# Patient Record
Sex: Female | Born: 1940
Health system: Southern US, Community
[De-identification: ages and names within clinical notes are randomized; demographics above are authoritative.]

## PROBLEM LIST (undated history)

## (undated) DIAGNOSIS — N189 Chronic kidney disease, unspecified: Secondary | ICD-10-CM

## (undated) DIAGNOSIS — E039 Hypothyroidism, unspecified: Secondary | ICD-10-CM

## (undated) DIAGNOSIS — C801 Malignant (primary) neoplasm, unspecified: Secondary | ICD-10-CM

## (undated) DIAGNOSIS — M1990 Unspecified osteoarthritis, unspecified site: Secondary | ICD-10-CM

## (undated) DIAGNOSIS — C4490 Unspecified malignant neoplasm of skin, unspecified: Secondary | ICD-10-CM

## (undated) DIAGNOSIS — I10 Essential (primary) hypertension: Secondary | ICD-10-CM

## (undated) DIAGNOSIS — M810 Age-related osteoporosis without current pathological fracture: Secondary | ICD-10-CM

## (undated) DIAGNOSIS — E785 Hyperlipidemia, unspecified: Secondary | ICD-10-CM

## (undated) HISTORY — PX: ABDOMINAL HYSTERECTOMY: SHX81

## (undated) HISTORY — PX: COLONOSCOPY: SHX174

## (undated) HISTORY — PX: CHOLECYSTECTOMY: SHX55

---

## 1978-07-23 DIAGNOSIS — C801 Malignant (primary) neoplasm, unspecified: Secondary | ICD-10-CM

## 1978-07-23 HISTORY — DX: Malignant (primary) neoplasm, unspecified: C80.1

## 2005-11-21 ENCOUNTER — Ambulatory Visit: Payer: Self-pay

## 2006-01-01 ENCOUNTER — Ambulatory Visit: Payer: Self-pay | Admitting: Nurse Practitioner

## 2007-02-12 ENCOUNTER — Ambulatory Visit: Payer: Self-pay | Admitting: Gastroenterology

## 2007-04-16 ENCOUNTER — Ambulatory Visit: Payer: Self-pay | Admitting: Family Medicine

## 2008-06-03 ENCOUNTER — Ambulatory Visit: Payer: Self-pay | Admitting: Family Medicine

## 2009-06-08 ENCOUNTER — Ambulatory Visit: Payer: Self-pay | Admitting: Family Medicine

## 2009-09-12 ENCOUNTER — Ambulatory Visit: Payer: Self-pay | Admitting: Gastroenterology

## 2010-06-19 ENCOUNTER — Ambulatory Visit: Payer: Self-pay | Admitting: Family Medicine

## 2011-07-26 ENCOUNTER — Ambulatory Visit: Payer: Self-pay | Admitting: Family Medicine

## 2012-07-31 ENCOUNTER — Ambulatory Visit: Payer: Self-pay | Admitting: Family Medicine

## 2013-08-03 ENCOUNTER — Ambulatory Visit: Payer: Self-pay | Admitting: Family Medicine

## 2014-07-26 DIAGNOSIS — E039 Hypothyroidism, unspecified: Secondary | ICD-10-CM | POA: Diagnosis not present

## 2014-07-26 DIAGNOSIS — M810 Age-related osteoporosis without current pathological fracture: Secondary | ICD-10-CM | POA: Diagnosis not present

## 2014-07-26 DIAGNOSIS — I10 Essential (primary) hypertension: Secondary | ICD-10-CM | POA: Diagnosis not present

## 2014-07-26 DIAGNOSIS — Z0000 Encounter for general adult medical examination without abnormal findings: Secondary | ICD-10-CM | POA: Diagnosis not present

## 2014-07-26 DIAGNOSIS — R300 Dysuria: Secondary | ICD-10-CM | POA: Diagnosis not present

## 2014-07-26 DIAGNOSIS — K219 Gastro-esophageal reflux disease without esophagitis: Secondary | ICD-10-CM | POA: Diagnosis not present

## 2014-08-05 ENCOUNTER — Ambulatory Visit: Payer: Self-pay | Admitting: Family Medicine

## 2014-08-05 DIAGNOSIS — Z1231 Encounter for screening mammogram for malignant neoplasm of breast: Secondary | ICD-10-CM | POA: Diagnosis not present

## 2014-09-08 DIAGNOSIS — I10 Essential (primary) hypertension: Secondary | ICD-10-CM | POA: Diagnosis not present

## 2014-09-08 DIAGNOSIS — E039 Hypothyroidism, unspecified: Secondary | ICD-10-CM | POA: Diagnosis not present

## 2014-09-20 ENCOUNTER — Ambulatory Visit: Payer: Self-pay | Admitting: Gastroenterology

## 2014-09-20 DIAGNOSIS — I10 Essential (primary) hypertension: Secondary | ICD-10-CM | POA: Diagnosis not present

## 2014-09-20 DIAGNOSIS — D122 Benign neoplasm of ascending colon: Secondary | ICD-10-CM | POA: Diagnosis not present

## 2014-09-20 DIAGNOSIS — Z7982 Long term (current) use of aspirin: Secondary | ICD-10-CM | POA: Diagnosis not present

## 2014-09-20 DIAGNOSIS — E079 Disorder of thyroid, unspecified: Secondary | ICD-10-CM | POA: Diagnosis not present

## 2014-09-20 DIAGNOSIS — K219 Gastro-esophageal reflux disease without esophagitis: Secondary | ICD-10-CM | POA: Diagnosis not present

## 2014-09-20 DIAGNOSIS — K5730 Diverticulosis of large intestine without perforation or abscess without bleeding: Secondary | ICD-10-CM | POA: Diagnosis not present

## 2014-09-20 DIAGNOSIS — Z1211 Encounter for screening for malignant neoplasm of colon: Secondary | ICD-10-CM | POA: Diagnosis not present

## 2014-09-20 DIAGNOSIS — Z86010 Personal history of colonic polyps: Secondary | ICD-10-CM | POA: Diagnosis not present

## 2014-09-20 DIAGNOSIS — D120 Benign neoplasm of cecum: Secondary | ICD-10-CM | POA: Diagnosis not present

## 2014-09-27 DIAGNOSIS — R509 Fever, unspecified: Secondary | ICD-10-CM | POA: Diagnosis not present

## 2014-09-27 DIAGNOSIS — J019 Acute sinusitis, unspecified: Secondary | ICD-10-CM | POA: Diagnosis not present

## 2014-09-27 DIAGNOSIS — B9689 Other specified bacterial agents as the cause of diseases classified elsewhere: Secondary | ICD-10-CM | POA: Diagnosis not present

## 2014-11-15 LAB — SURGICAL PATHOLOGY

## 2015-01-26 DIAGNOSIS — K219 Gastro-esophageal reflux disease without esophagitis: Secondary | ICD-10-CM | POA: Diagnosis not present

## 2015-01-26 DIAGNOSIS — E039 Hypothyroidism, unspecified: Secondary | ICD-10-CM | POA: Diagnosis not present

## 2015-01-26 DIAGNOSIS — I10 Essential (primary) hypertension: Secondary | ICD-10-CM | POA: Diagnosis not present

## 2015-03-31 DIAGNOSIS — R946 Abnormal results of thyroid function studies: Secondary | ICD-10-CM | POA: Diagnosis not present

## 2015-07-28 DIAGNOSIS — E785 Hyperlipidemia, unspecified: Secondary | ICD-10-CM | POA: Diagnosis not present

## 2015-07-28 DIAGNOSIS — I10 Essential (primary) hypertension: Secondary | ICD-10-CM | POA: Diagnosis not present

## 2015-07-28 DIAGNOSIS — M810 Age-related osteoporosis without current pathological fracture: Secondary | ICD-10-CM | POA: Diagnosis not present

## 2015-07-28 DIAGNOSIS — Z1239 Encounter for other screening for malignant neoplasm of breast: Secondary | ICD-10-CM | POA: Diagnosis not present

## 2015-07-28 DIAGNOSIS — E039 Hypothyroidism, unspecified: Secondary | ICD-10-CM | POA: Diagnosis not present

## 2015-07-28 DIAGNOSIS — Z0000 Encounter for general adult medical examination without abnormal findings: Secondary | ICD-10-CM | POA: Diagnosis not present

## 2015-07-28 DIAGNOSIS — K219 Gastro-esophageal reflux disease without esophagitis: Secondary | ICD-10-CM | POA: Diagnosis not present

## 2015-08-05 ENCOUNTER — Other Ambulatory Visit: Payer: Self-pay | Admitting: Family Medicine

## 2015-08-05 DIAGNOSIS — Z1231 Encounter for screening mammogram for malignant neoplasm of breast: Secondary | ICD-10-CM

## 2015-08-08 DIAGNOSIS — M810 Age-related osteoporosis without current pathological fracture: Secondary | ICD-10-CM | POA: Diagnosis not present

## 2015-08-09 DIAGNOSIS — E785 Hyperlipidemia, unspecified: Secondary | ICD-10-CM | POA: Diagnosis not present

## 2015-08-09 DIAGNOSIS — E039 Hypothyroidism, unspecified: Secondary | ICD-10-CM | POA: Diagnosis not present

## 2015-08-09 DIAGNOSIS — I10 Essential (primary) hypertension: Secondary | ICD-10-CM | POA: Diagnosis not present

## 2015-08-19 ENCOUNTER — Other Ambulatory Visit: Payer: Self-pay | Admitting: Family Medicine

## 2015-08-19 ENCOUNTER — Ambulatory Visit
Admission: RE | Admit: 2015-08-19 | Discharge: 2015-08-19 | Disposition: A | Discharge status: Home / Self Care | Payer: Commercial Managed Care - HMO | Source: Ambulatory Visit | Attending: Family Medicine | Admitting: Family Medicine

## 2015-08-19 DIAGNOSIS — Z1231 Encounter for screening mammogram for malignant neoplasm of breast: Secondary | ICD-10-CM

## 2015-08-19 HISTORY — DX: Malignant (primary) neoplasm, unspecified: C80.1

## 2015-08-23 DIAGNOSIS — R7989 Other specified abnormal findings of blood chemistry: Secondary | ICD-10-CM | POA: Diagnosis not present

## 2016-01-30 DIAGNOSIS — K219 Gastro-esophageal reflux disease without esophagitis: Secondary | ICD-10-CM | POA: Diagnosis not present

## 2016-01-30 DIAGNOSIS — E039 Hypothyroidism, unspecified: Secondary | ICD-10-CM | POA: Diagnosis not present

## 2016-01-30 DIAGNOSIS — E785 Hyperlipidemia, unspecified: Secondary | ICD-10-CM | POA: Diagnosis not present

## 2016-01-30 DIAGNOSIS — I10 Essential (primary) hypertension: Secondary | ICD-10-CM | POA: Diagnosis not present

## 2016-01-30 DIAGNOSIS — M810 Age-related osteoporosis without current pathological fracture: Secondary | ICD-10-CM | POA: Diagnosis not present

## 2016-07-11 ENCOUNTER — Other Ambulatory Visit: Payer: Self-pay | Admitting: Family Medicine

## 2016-07-11 DIAGNOSIS — Z1231 Encounter for screening mammogram for malignant neoplasm of breast: Secondary | ICD-10-CM

## 2016-08-20 DIAGNOSIS — E039 Hypothyroidism, unspecified: Secondary | ICD-10-CM | POA: Diagnosis not present

## 2016-08-20 DIAGNOSIS — M810 Age-related osteoporosis without current pathological fracture: Secondary | ICD-10-CM | POA: Diagnosis not present

## 2016-08-20 DIAGNOSIS — Z0000 Encounter for general adult medical examination without abnormal findings: Secondary | ICD-10-CM | POA: Diagnosis not present

## 2016-08-20 DIAGNOSIS — K219 Gastro-esophageal reflux disease without esophagitis: Secondary | ICD-10-CM | POA: Diagnosis not present

## 2016-08-20 DIAGNOSIS — E785 Hyperlipidemia, unspecified: Secondary | ICD-10-CM | POA: Diagnosis not present

## 2016-08-20 DIAGNOSIS — I10 Essential (primary) hypertension: Secondary | ICD-10-CM | POA: Diagnosis not present

## 2016-08-21 ENCOUNTER — Ambulatory Visit
Admission: RE | Admit: 2016-08-21 | Discharge: 2016-08-21 | Disposition: A | Discharge status: Home / Self Care | Payer: Commercial Managed Care - HMO | Source: Ambulatory Visit | Attending: Family Medicine | Admitting: Family Medicine

## 2016-08-21 DIAGNOSIS — Z1231 Encounter for screening mammogram for malignant neoplasm of breast: Secondary | ICD-10-CM | POA: Diagnosis not present

## 2016-10-05 DIAGNOSIS — I10 Essential (primary) hypertension: Secondary | ICD-10-CM | POA: Diagnosis not present

## 2016-10-05 DIAGNOSIS — E782 Mixed hyperlipidemia: Secondary | ICD-10-CM | POA: Diagnosis not present

## 2017-02-18 DIAGNOSIS — K219 Gastro-esophageal reflux disease without esophagitis: Secondary | ICD-10-CM | POA: Diagnosis not present

## 2017-02-18 DIAGNOSIS — E785 Hyperlipidemia, unspecified: Secondary | ICD-10-CM | POA: Diagnosis not present

## 2017-02-18 DIAGNOSIS — E039 Hypothyroidism, unspecified: Secondary | ICD-10-CM | POA: Diagnosis not present

## 2017-02-18 DIAGNOSIS — I10 Essential (primary) hypertension: Secondary | ICD-10-CM | POA: Diagnosis not present

## 2017-05-10 DIAGNOSIS — L989 Disorder of the skin and subcutaneous tissue, unspecified: Secondary | ICD-10-CM | POA: Diagnosis not present

## 2017-05-23 DIAGNOSIS — C4490 Unspecified malignant neoplasm of skin, unspecified: Secondary | ICD-10-CM

## 2017-05-23 HISTORY — DX: Unspecified malignant neoplasm of skin, unspecified: C44.90

## 2017-06-04 DIAGNOSIS — D485 Neoplasm of uncertain behavior of skin: Secondary | ICD-10-CM | POA: Diagnosis not present

## 2017-06-04 DIAGNOSIS — C441191 Basal cell carcinoma of skin of left upper eyelid, including canthus: Secondary | ICD-10-CM | POA: Diagnosis not present

## 2017-06-04 DIAGNOSIS — L821 Other seborrheic keratosis: Secondary | ICD-10-CM | POA: Diagnosis not present

## 2017-06-04 DIAGNOSIS — L570 Actinic keratosis: Secondary | ICD-10-CM | POA: Diagnosis not present

## 2017-06-04 DIAGNOSIS — L853 Xerosis cutis: Secondary | ICD-10-CM | POA: Diagnosis not present

## 2017-06-04 DIAGNOSIS — C44311 Basal cell carcinoma of skin of nose: Secondary | ICD-10-CM | POA: Diagnosis not present

## 2017-06-04 DIAGNOSIS — D0439 Carcinoma in situ of skin of other parts of face: Secondary | ICD-10-CM | POA: Diagnosis not present

## 2017-07-22 ENCOUNTER — Other Ambulatory Visit: Payer: Self-pay | Admitting: Family Medicine

## 2017-07-22 DIAGNOSIS — Z1231 Encounter for screening mammogram for malignant neoplasm of breast: Secondary | ICD-10-CM

## 2017-08-21 DIAGNOSIS — I10 Essential (primary) hypertension: Secondary | ICD-10-CM | POA: Diagnosis not present

## 2017-08-21 DIAGNOSIS — E039 Hypothyroidism, unspecified: Secondary | ICD-10-CM | POA: Diagnosis not present

## 2017-08-21 DIAGNOSIS — M810 Age-related osteoporosis without current pathological fracture: Secondary | ICD-10-CM | POA: Diagnosis not present

## 2017-08-21 DIAGNOSIS — K219 Gastro-esophageal reflux disease without esophagitis: Secondary | ICD-10-CM | POA: Diagnosis not present

## 2017-08-21 DIAGNOSIS — N183 Chronic kidney disease, stage 3 (moderate): Secondary | ICD-10-CM | POA: Diagnosis not present

## 2017-08-21 DIAGNOSIS — Z0000 Encounter for general adult medical examination without abnormal findings: Secondary | ICD-10-CM | POA: Diagnosis not present

## 2017-08-21 DIAGNOSIS — E785 Hyperlipidemia, unspecified: Secondary | ICD-10-CM | POA: Diagnosis not present

## 2017-08-23 ENCOUNTER — Ambulatory Visit
Admission: RE | Admit: 2017-08-23 | Discharge: 2017-08-23 | Disposition: A | Discharge status: Home / Self Care | Payer: Medicare HMO | Source: Ambulatory Visit | Attending: Family Medicine | Admitting: Family Medicine

## 2017-08-23 DIAGNOSIS — Z1231 Encounter for screening mammogram for malignant neoplasm of breast: Secondary | ICD-10-CM | POA: Diagnosis not present

## 2017-08-23 DIAGNOSIS — R928 Other abnormal and inconclusive findings on diagnostic imaging of breast: Secondary | ICD-10-CM | POA: Diagnosis not present

## 2017-08-27 ENCOUNTER — Other Ambulatory Visit: Payer: Self-pay | Admitting: Family Medicine

## 2017-08-27 DIAGNOSIS — R928 Other abnormal and inconclusive findings on diagnostic imaging of breast: Secondary | ICD-10-CM

## 2017-08-27 DIAGNOSIS — N6320 Unspecified lump in the left breast, unspecified quadrant: Secondary | ICD-10-CM

## 2017-09-03 ENCOUNTER — Ambulatory Visit
Admission: RE | Admit: 2017-09-03 | Discharge: 2017-09-03 | Disposition: A | Discharge status: Home / Self Care | Payer: Medicare HMO | Source: Ambulatory Visit | Attending: Family Medicine | Admitting: Family Medicine

## 2017-09-03 DIAGNOSIS — N6320 Unspecified lump in the left breast, unspecified quadrant: Secondary | ICD-10-CM

## 2017-09-03 DIAGNOSIS — R928 Other abnormal and inconclusive findings on diagnostic imaging of breast: Secondary | ICD-10-CM | POA: Diagnosis not present

## 2017-09-03 DIAGNOSIS — Z85828 Personal history of other malignant neoplasm of skin: Secondary | ICD-10-CM | POA: Diagnosis not present

## 2017-09-03 DIAGNOSIS — D239 Other benign neoplasm of skin, unspecified: Secondary | ICD-10-CM | POA: Diagnosis not present

## 2017-09-03 HISTORY — DX: Unspecified malignant neoplasm of skin, unspecified: C44.90

## 2018-02-28 DIAGNOSIS — I10 Essential (primary) hypertension: Secondary | ICD-10-CM | POA: Diagnosis not present

## 2018-02-28 DIAGNOSIS — E039 Hypothyroidism, unspecified: Secondary | ICD-10-CM | POA: Diagnosis not present

## 2018-03-03 DIAGNOSIS — D1800 Hemangioma unspecified site: Secondary | ICD-10-CM | POA: Diagnosis not present

## 2018-03-03 DIAGNOSIS — D229 Melanocytic nevi, unspecified: Secondary | ICD-10-CM | POA: Diagnosis not present

## 2018-03-03 DIAGNOSIS — Z85828 Personal history of other malignant neoplasm of skin: Secondary | ICD-10-CM | POA: Diagnosis not present

## 2018-03-03 DIAGNOSIS — D179 Benign lipomatous neoplasm, unspecified: Secondary | ICD-10-CM | POA: Diagnosis not present

## 2018-03-03 DIAGNOSIS — Z1283 Encounter for screening for malignant neoplasm of skin: Secondary | ICD-10-CM | POA: Diagnosis not present

## 2018-03-03 DIAGNOSIS — L570 Actinic keratosis: Secondary | ICD-10-CM | POA: Diagnosis not present

## 2018-03-03 DIAGNOSIS — D692 Other nonthrombocytopenic purpura: Secondary | ICD-10-CM | POA: Diagnosis not present

## 2018-03-05 DIAGNOSIS — I10 Essential (primary) hypertension: Secondary | ICD-10-CM | POA: Diagnosis not present

## 2018-03-05 DIAGNOSIS — M25562 Pain in left knee: Secondary | ICD-10-CM | POA: Diagnosis not present

## 2018-03-05 DIAGNOSIS — M1712 Unilateral primary osteoarthritis, left knee: Secondary | ICD-10-CM | POA: Diagnosis not present

## 2018-03-05 DIAGNOSIS — M25561 Pain in right knee: Secondary | ICD-10-CM | POA: Diagnosis not present

## 2018-03-05 DIAGNOSIS — E785 Hyperlipidemia, unspecified: Secondary | ICD-10-CM | POA: Diagnosis not present

## 2018-03-05 DIAGNOSIS — E559 Vitamin D deficiency, unspecified: Secondary | ICD-10-CM | POA: Diagnosis not present

## 2018-03-05 DIAGNOSIS — M1711 Unilateral primary osteoarthritis, right knee: Secondary | ICD-10-CM | POA: Diagnosis not present

## 2018-03-05 DIAGNOSIS — E039 Hypothyroidism, unspecified: Secondary | ICD-10-CM | POA: Diagnosis not present

## 2018-07-24 DIAGNOSIS — J069 Acute upper respiratory infection, unspecified: Secondary | ICD-10-CM | POA: Diagnosis not present

## 2018-07-24 DIAGNOSIS — R05 Cough: Secondary | ICD-10-CM | POA: Diagnosis not present

## 2018-07-30 ENCOUNTER — Other Ambulatory Visit: Payer: Self-pay | Admitting: Family Medicine

## 2018-07-30 DIAGNOSIS — Z1231 Encounter for screening mammogram for malignant neoplasm of breast: Secondary | ICD-10-CM

## 2018-08-26 ENCOUNTER — Ambulatory Visit
Admission: RE | Admit: 2018-08-26 | Discharge: 2018-08-26 | Disposition: A | Discharge status: Home / Self Care | Payer: Medicare HMO | Source: Ambulatory Visit | Attending: Family Medicine | Admitting: Family Medicine

## 2018-08-26 DIAGNOSIS — Z1231 Encounter for screening mammogram for malignant neoplasm of breast: Secondary | ICD-10-CM | POA: Insufficient documentation

## 2018-09-08 DIAGNOSIS — Z85828 Personal history of other malignant neoplasm of skin: Secondary | ICD-10-CM | POA: Diagnosis not present

## 2018-09-08 DIAGNOSIS — L578 Other skin changes due to chronic exposure to nonionizing radiation: Secondary | ICD-10-CM | POA: Diagnosis not present

## 2018-09-08 DIAGNOSIS — L570 Actinic keratosis: Secondary | ICD-10-CM | POA: Diagnosis not present

## 2018-10-24 DIAGNOSIS — E559 Vitamin D deficiency, unspecified: Secondary | ICD-10-CM | POA: Diagnosis not present

## 2018-10-24 DIAGNOSIS — E039 Hypothyroidism, unspecified: Secondary | ICD-10-CM | POA: Diagnosis not present

## 2018-10-24 DIAGNOSIS — I10 Essential (primary) hypertension: Secondary | ICD-10-CM | POA: Diagnosis not present

## 2018-10-24 DIAGNOSIS — E785 Hyperlipidemia, unspecified: Secondary | ICD-10-CM | POA: Diagnosis not present

## 2019-01-09 DIAGNOSIS — I10 Essential (primary) hypertension: Secondary | ICD-10-CM | POA: Diagnosis not present

## 2019-01-09 DIAGNOSIS — E785 Hyperlipidemia, unspecified: Secondary | ICD-10-CM | POA: Diagnosis not present

## 2019-01-09 DIAGNOSIS — M170 Bilateral primary osteoarthritis of knee: Secondary | ICD-10-CM | POA: Diagnosis not present

## 2019-01-09 DIAGNOSIS — K219 Gastro-esophageal reflux disease without esophagitis: Secondary | ICD-10-CM | POA: Diagnosis not present

## 2019-01-09 DIAGNOSIS — N183 Chronic kidney disease, stage 3 (moderate): Secondary | ICD-10-CM | POA: Diagnosis not present

## 2019-01-09 DIAGNOSIS — E039 Hypothyroidism, unspecified: Secondary | ICD-10-CM | POA: Diagnosis not present

## 2019-01-09 DIAGNOSIS — Z0000 Encounter for general adult medical examination without abnormal findings: Secondary | ICD-10-CM | POA: Diagnosis not present

## 2019-01-09 DIAGNOSIS — M810 Age-related osteoporosis without current pathological fracture: Secondary | ICD-10-CM | POA: Diagnosis not present

## 2019-01-20 DIAGNOSIS — M8588 Other specified disorders of bone density and structure, other site: Secondary | ICD-10-CM | POA: Diagnosis not present

## 2019-07-07 DIAGNOSIS — I10 Essential (primary) hypertension: Secondary | ICD-10-CM | POA: Diagnosis not present

## 2019-07-13 DIAGNOSIS — G2581 Restless legs syndrome: Secondary | ICD-10-CM | POA: Diagnosis not present

## 2019-07-13 DIAGNOSIS — M170 Bilateral primary osteoarthritis of knee: Secondary | ICD-10-CM | POA: Diagnosis not present

## 2019-07-13 DIAGNOSIS — I10 Essential (primary) hypertension: Secondary | ICD-10-CM | POA: Diagnosis not present

## 2019-07-13 DIAGNOSIS — M810 Age-related osteoporosis without current pathological fracture: Secondary | ICD-10-CM | POA: Diagnosis not present

## 2019-07-13 DIAGNOSIS — Z87891 Personal history of nicotine dependence: Secondary | ICD-10-CM | POA: Diagnosis not present

## 2019-07-13 DIAGNOSIS — E785 Hyperlipidemia, unspecified: Secondary | ICD-10-CM | POA: Diagnosis not present

## 2019-07-13 DIAGNOSIS — E039 Hypothyroidism, unspecified: Secondary | ICD-10-CM | POA: Diagnosis not present

## 2019-07-21 ENCOUNTER — Other Ambulatory Visit: Payer: Self-pay | Admitting: Family Medicine

## 2019-07-21 DIAGNOSIS — Z1231 Encounter for screening mammogram for malignant neoplasm of breast: Secondary | ICD-10-CM

## 2019-08-31 ENCOUNTER — Ambulatory Visit
Admission: RE | Admit: 2019-08-31 | Discharge: 2019-08-31 | Disposition: A | Discharge status: Home / Self Care | Payer: Medicare HMO | Source: Ambulatory Visit | Attending: Family Medicine | Admitting: Family Medicine

## 2019-08-31 DIAGNOSIS — Z1231 Encounter for screening mammogram for malignant neoplasm of breast: Secondary | ICD-10-CM | POA: Insufficient documentation

## 2020-01-28 DIAGNOSIS — H40003 Preglaucoma, unspecified, bilateral: Secondary | ICD-10-CM | POA: Diagnosis not present

## 2020-01-29 DIAGNOSIS — Z0100 Encounter for examination of eyes and vision without abnormal findings: Secondary | ICD-10-CM | POA: Diagnosis not present

## 2020-04-22 DIAGNOSIS — E039 Hypothyroidism, unspecified: Secondary | ICD-10-CM | POA: Diagnosis not present

## 2020-04-22 DIAGNOSIS — E785 Hyperlipidemia, unspecified: Secondary | ICD-10-CM | POA: Diagnosis not present

## 2020-04-22 DIAGNOSIS — Z0000 Encounter for general adult medical examination without abnormal findings: Secondary | ICD-10-CM | POA: Diagnosis not present

## 2020-04-22 DIAGNOSIS — N1832 Chronic kidney disease, stage 3b: Secondary | ICD-10-CM | POA: Diagnosis not present

## 2020-04-22 DIAGNOSIS — M810 Age-related osteoporosis without current pathological fracture: Secondary | ICD-10-CM | POA: Diagnosis not present

## 2020-04-22 DIAGNOSIS — I10 Essential (primary) hypertension: Secondary | ICD-10-CM | POA: Diagnosis not present

## 2020-04-25 DIAGNOSIS — E039 Hypothyroidism, unspecified: Secondary | ICD-10-CM | POA: Diagnosis not present

## 2020-04-25 DIAGNOSIS — Z0000 Encounter for general adult medical examination without abnormal findings: Secondary | ICD-10-CM | POA: Diagnosis not present

## 2020-04-25 DIAGNOSIS — E785 Hyperlipidemia, unspecified: Secondary | ICD-10-CM | POA: Diagnosis not present

## 2020-04-25 DIAGNOSIS — N189 Chronic kidney disease, unspecified: Secondary | ICD-10-CM | POA: Diagnosis not present

## 2020-04-25 DIAGNOSIS — K219 Gastro-esophageal reflux disease without esophagitis: Secondary | ICD-10-CM | POA: Diagnosis not present

## 2020-04-25 DIAGNOSIS — M25569 Pain in unspecified knee: Secondary | ICD-10-CM | POA: Diagnosis not present

## 2020-04-25 DIAGNOSIS — M810 Age-related osteoporosis without current pathological fracture: Secondary | ICD-10-CM | POA: Diagnosis not present

## 2020-04-25 DIAGNOSIS — I129 Hypertensive chronic kidney disease with stage 1 through stage 4 chronic kidney disease, or unspecified chronic kidney disease: Secondary | ICD-10-CM | POA: Diagnosis not present

## 2020-05-10 DIAGNOSIS — M25561 Pain in right knee: Secondary | ICD-10-CM | POA: Diagnosis not present

## 2020-05-10 DIAGNOSIS — M170 Bilateral primary osteoarthritis of knee: Secondary | ICD-10-CM | POA: Diagnosis not present

## 2020-05-10 DIAGNOSIS — M25562 Pain in left knee: Secondary | ICD-10-CM | POA: Diagnosis not present

## 2020-05-11 DIAGNOSIS — R7989 Other specified abnormal findings of blood chemistry: Secondary | ICD-10-CM | POA: Diagnosis not present

## 2020-07-26 DIAGNOSIS — M170 Bilateral primary osteoarthritis of knee: Secondary | ICD-10-CM | POA: Diagnosis not present

## 2020-09-05 ENCOUNTER — Other Ambulatory Visit: Payer: Self-pay

## 2020-09-05 ENCOUNTER — Encounter
Admission: RE | Admit: 2020-09-05 | Discharge: 2020-09-05 | Disposition: A | Discharge status: Home / Self Care | Payer: Medicare HMO | Source: Ambulatory Visit | Attending: Orthopedic Surgery | Admitting: Orthopedic Surgery

## 2020-09-05 DIAGNOSIS — R799 Abnormal finding of blood chemistry, unspecified: Secondary | ICD-10-CM | POA: Diagnosis not present

## 2020-09-05 DIAGNOSIS — Z01818 Encounter for other preprocedural examination: Secondary | ICD-10-CM | POA: Insufficient documentation

## 2020-09-05 DIAGNOSIS — Z136 Encounter for screening for cardiovascular disorders: Secondary | ICD-10-CM | POA: Diagnosis not present

## 2020-09-05 HISTORY — DX: Chronic kidney disease, unspecified: N18.9

## 2020-09-05 HISTORY — DX: Hyperlipidemia, unspecified: E78.5

## 2020-09-05 HISTORY — DX: Unspecified osteoarthritis, unspecified site: M19.90

## 2020-09-05 HISTORY — DX: Essential (primary) hypertension: I10

## 2020-09-05 HISTORY — DX: Hypothyroidism, unspecified: E03.9

## 2020-09-05 LAB — COMPREHENSIVE METABOLIC PANEL
ALT: 8 U/L (ref 0–44)
AST: 15 U/L (ref 15–41)
Albumin: 3.4 g/dL — ABNORMAL LOW (ref 3.5–5.0)
Alkaline Phosphatase: 76 U/L (ref 38–126)
Anion gap: 12 (ref 5–15)
BUN: 25 mg/dL — ABNORMAL HIGH (ref 8–23)
CO2: 25 mmol/L (ref 22–32)
Calcium: 9.1 mg/dL (ref 8.9–10.3)
Chloride: 104 mmol/L (ref 98–111)
Creatinine, Ser: 1.69 mg/dL — ABNORMAL HIGH (ref 0.44–1.00)
GFR, Estimated: 31 mL/min — ABNORMAL LOW (ref >60)
Glucose, Bld: 80 mg/dL (ref 70–99)
Potassium: 3.4 mmol/L — ABNORMAL LOW (ref 3.5–5.1)
Sodium: 141 mmol/L (ref 135–145)
Total Bilirubin: 0.3 mg/dL (ref 0.3–1.2)
Total Protein: 6.7 g/dL (ref 6.5–8.1)

## 2020-09-05 LAB — URINALYSIS, ROUTINE W REFLEX MICROSCOPIC
Bacteria, UA: NONE SEEN
Bilirubin Urine: NEGATIVE
Glucose, UA: NEGATIVE mg/dL
Ketones, ur: NEGATIVE mg/dL
Leukocytes,Ua: NEGATIVE
Nitrite: NEGATIVE
Protein, ur: NEGATIVE mg/dL
Specific Gravity, Urine: 1.018 (ref 1.005–1.030)
pH: 5 (ref 5.0–8.0)

## 2020-09-05 LAB — CBC
HCT: 34.9 % — ABNORMAL LOW (ref 36.0–46.0)
Hemoglobin: 11 g/dL — ABNORMAL LOW (ref 12.0–15.0)
MCH: 30.1 pg (ref 26.0–34.0)
MCHC: 31.5 g/dL (ref 30.0–36.0)
MCV: 95.4 fL (ref 80.0–100.0)
Platelets: 286 10*3/uL (ref 150–400)
RBC: 3.66 MIL/uL — ABNORMAL LOW (ref 3.87–5.11)
RDW: 13 % (ref 11.5–15.5)
WBC: 8.2 10*3/uL (ref 4.0–10.5)
nRBC: 0 % (ref 0.0–0.2)

## 2020-09-05 LAB — SEDIMENTATION RATE: Sed Rate: 32 mm/hr — ABNORMAL HIGH (ref 0–30)

## 2020-09-05 LAB — TYPE AND SCREEN
ABO/RH(D): O POS
Antibody Screen: NEGATIVE

## 2020-09-05 LAB — PROTIME-INR
INR: 0.9 (ref 0.8–1.2)
Prothrombin Time: 12 seconds (ref 11.4–15.2)

## 2020-09-05 LAB — C-REACTIVE PROTEIN: CRP: 0.7 mg/dL (ref <1.0)

## 2020-09-05 LAB — SURGICAL PCR SCREEN
MRSA, PCR: POSITIVE — AB
Staphylococcus aureus: POSITIVE — AB

## 2020-09-05 LAB — APTT: aPTT: 29 seconds (ref 24–36)

## 2020-09-05 NOTE — Patient Instructions (Addendum)
Your procedure is scheduled on: 09/12/20- MONDAY Report to the Registration Desk on the 1st floor of the [facility]. To find out your arrival time, please call [phone] between 1PM - 3PM on: 09/09/20- FRIDAY  REMEMBER: Instructions that are not followed completely may result in serious medical risk, up to and including death; or upon the discretion of your surgeon and anesthesiologist your surgery may need to be rescheduled.  Do not eat food OR DRINK ANY FLUIDS after midnight the night before surgery.  No gum chewing, lozengers or hard candies.   In addition, your doctor has ordered for you to drink the provided  Ensure Pre-Surgery Clear Carbohydrate Drink  Drinking this carbohydrate drink up to two hours before surgery helps to reduce insulin resistance and improve patient outcomes. Please complete drinking 2 hours prior to scheduled arrival time.  TAKE THESE MEDICATIONS THE MORNING OF SURGERY WITH A SIP OF WATER: - levothyroxine (SYNTHROID) 50 MCG tablet - omeprazole (PRILOSEC) 20 MG capsule take one the night before and one on the morning of surgery - helps to prevent nausea after surgery. - rOPINIRole (REQUIP) 0.25 MG tablet  One week prior to surgery: STOP STARTING 09/05/20-  Stop Anti-inflammatories (NSAIDS) such as Advil, Aleve, Ibuprofen, Motrin, Naproxen, Naprosyn and Aspirin based products such as Excedrin, Goodys Powder, BC Powder.   Stop STARTING TODAY 09/05/20 ANY OVER THE COUNTER supplements until after surgery,  Omega-3 Fatty Acids (FISH OIL) 1000 MG CAPS, magnesium oxide (MAG-OX) 400 MG tablet (However, you may continue taking Vitamin D, Vitamin B, and multivitamin up until the day before surgery.)  No Alcohol for 24 hours before or after surgery.  No Smoking including e-cigarettes for 24 hours prior to surgery.  No chewable tobacco products for at least 6 hours prior to surgery.  No nicotine patches on the day of surgery.  Do not use any "recreational" drugs  for at least a week prior to your surgery.  Please be advised that the combination of cocaine and anesthesia may have negative outcomes, up to and including death. If you test positive for cocaine, your surgery will be cancelled.  On the morning of surgery brush your teeth with toothpaste and water, you may rinse your mouth with mouthwash if you wish. Do not swallow any toothpaste or mouthwash.  Do not wear jewelry, make-up, hairpins, clips or nail polish.  Do not wear lotions, powders, or perfumes.   Do not shave body from the neck down 48 hours prior to surgery just in case you cut yourself which could leave a site for infection.  Also, freshly shaved skin may become irritated if using the CHG soap.  Contact lenses, hearing aids and dentures may not be worn into surgery.  Do not bring valuables to the hospital. [facility] is not responsible for any missing/lost belongings or valuables.    Notify your doctor if there is any change in your medical condition (cold, fever, infection).  Wear comfortable clothing (specific to your surgery type) to the hospital.  Plan for stool softeners for home use; pain medications have a tendency to cause constipation. You can also help prevent constipation by eating foods high in fiber such as fruits and vegetables and drinking plenty of fluids as your diet allows.  After surgery, you can help prevent lung complications by doing breathing exercises.  Take deep breaths and cough every 1-2 hours. Your doctor may order a device called an Incentive Spirometer to help you take deep breaths. When coughing or sneezing,  hold a pillow firmly against your incision with both hands. This is called "splinting." Doing this helps protect your incision. It also decreases belly discomfort.  If you are being admitted to the hospital overnight, leave your suitcase in the car. After surgery it may be brought to your room.  If you are being discharged the day of surgery,  you will not be allowed to drive home. You will need a responsible adult (18 years or older) to drive you home and stay with you that night.   If you are taking public transportation, you will need to have a responsible adult (18 years or older) with you. Please confirm with your physician that it is acceptable to use public transportation.   Please call the [facility] Dept. at [phone] if you have any questions about these instructions.  Visitation Policy:  Patients undergoing a surgery or procedure may have one family member or support person with them as long as that person is not COVID-19 positive or experiencing its symptoms.  That person may remain in the waiting area during the procedure.  Inpatient Visitation:    Visiting hours are 7 a.m. to 8 p.m. Patients will be allowed one visitor. The visitor may change daily. The visitor must pass COVID-19 screenings, use hand sanitizer when entering and exiting the patient's room and wear a mask at all times, including in the patient's room. Patients must also wear a mask when staff or their visitor are in the room. Masking is required regardless of vaccination status. Systemwide, no visitors 17 or younger.

## 2020-09-05 NOTE — Progress Notes (Signed)
  [facility] Perioperative Services: Pre-Admission/Anesthesia Testing  Abnormal Lab Notification  Date: 09/05/20  Name: Crystal Campbell MRN:   [id number]  Re: Abnormal labs noted during PAT appointment  Provider Notified: Dereck Leep, MD Notification mode: Routed and/or faxed via [place] of concern: Lab Results  Component Value Date   STAPHAUREUS POSITIVE (A) 09/05/2020   MRSAPCR POSITIVE (A) 09/05/2020    Notes: Patient is scheduled for a COMPUTER ASSISTED TOTAL KNEE ARTHROPLASTY (Right Knee) on 09/13/2019.   This is a Community education officer; no formal response required.   Honor Loh, MSN, APRN, FNP-C, CEN [facility]  Peri-operative Services Nurse Practitioner Phone: [phone] 09/05/20 11:29 AM

## 2020-09-06 LAB — URINE CULTURE
Culture: NO GROWTH
Special Requests: NORMAL

## 2020-09-08 ENCOUNTER — Other Ambulatory Visit
Admission: RE | Admit: 2020-09-08 | Discharge: 2020-09-08 | Disposition: A | Discharge status: Home / Self Care | Payer: Medicare HMO | Source: Ambulatory Visit | Attending: Orthopedic Surgery | Admitting: Orthopedic Surgery

## 2020-09-08 ENCOUNTER — Other Ambulatory Visit: Payer: Self-pay

## 2020-09-08 DIAGNOSIS — Z20822 Contact with and (suspected) exposure to covid-19: Secondary | ICD-10-CM | POA: Diagnosis not present

## 2020-09-08 DIAGNOSIS — Z01812 Encounter for preprocedural laboratory examination: Secondary | ICD-10-CM | POA: Insufficient documentation

## 2020-09-08 DIAGNOSIS — M1711 Unilateral primary osteoarthritis, right knee: Secondary | ICD-10-CM | POA: Diagnosis not present

## 2020-09-08 LAB — SARS CORONAVIRUS 2 (TAT 6-24 HRS): SARS Coronavirus 2: NEGATIVE

## 2020-09-11 ENCOUNTER — Encounter: Payer: Self-pay | Admitting: Orthopedic Surgery

## 2020-09-11 DIAGNOSIS — M810 Age-related osteoporosis without current pathological fracture: Secondary | ICD-10-CM | POA: Insufficient documentation

## 2020-09-11 DIAGNOSIS — J309 Allergic rhinitis, unspecified: Secondary | ICD-10-CM | POA: Insufficient documentation

## 2020-09-11 DIAGNOSIS — I10 Essential (primary) hypertension: Secondary | ICD-10-CM | POA: Insufficient documentation

## 2020-09-11 MED ORDER — TRANEXAMIC ACID-NACL 1000-0.7 MG/100ML-% IV SOLN
1000.0000 mg | INTRAVENOUS | Status: AC
  Administered 2020-09-12: 1000 mg via INTRAVENOUS

## 2020-09-11 MED ORDER — LACTATED RINGERS IV SOLN: INTRAVENOUS | Status: DC

## 2020-09-11 MED ORDER — CELECOXIB 200 MG PO CAPS: 400.0000 mg | ORAL_CAPSULE | Freq: Once | ORAL | Status: AC

## 2020-09-11 MED ORDER — ORAL CARE MOUTH RINSE: 15.0000 mL | Freq: Once | OROMUCOSAL | Status: DC

## 2020-09-11 MED ORDER — VANCOMYCIN HCL IN DEXTROSE 1-5 GM/200ML-% IV SOLN: 1000.0000 mg | Freq: Once | INTRAVENOUS | Status: AC

## 2020-09-11 MED ORDER — DEXAMETHASONE SODIUM PHOSPHATE 10 MG/ML IJ SOLN: 8.0000 mg | Freq: Once | INTRAMUSCULAR | Status: AC

## 2020-09-11 MED ORDER — CHLORHEXIDINE GLUCONATE 0.12 % MT SOLN: 15.0000 mL | Freq: Once | OROMUCOSAL | Status: DC

## 2020-09-11 MED ORDER — CHLORHEXIDINE GLUCONATE 4 % EX LIQD: 60.0000 mL | Freq: Once | CUTANEOUS | Status: DC

## 2020-09-11 MED ORDER — CEFAZOLIN SODIUM-DEXTROSE 2-4 GM/100ML-% IV SOLN
2.0000 g | INTRAVENOUS | Status: AC
  Administered 2020-09-12: 2 g via INTRAVENOUS

## 2020-09-11 MED ORDER — GABAPENTIN 300 MG PO CAPS: 300.0000 mg | ORAL_CAPSULE | Freq: Once | ORAL | Status: AC

## 2020-09-11 NOTE — H&P (Signed)
ORTHOPAEDIC HISTORY & PHYSICAL Gwenlyn Fudge, [place] - 09/08/2020 8:00 AM EST Formatting of this note is different from the original. [place] MEDICINE Chief Complaint:   Chief Complaint  Patient presents with  . Knee Pain  H & P RIGHT KNEE   History of Present Illness:   Crystal Campbell is a 80 y.o. female that presents to clinic today for her preoperative history and evaluation. Patient presents unaccompanied. The patient is scheduled to undergo a right total knee arthroplasty on 09/12/20 by Dr. Marry Guan. Her pain began several years ago. The pain is located along the anterior aspect of the knees. She describes her pain as worse with going up and down stairs and rising from a seated position. She reports associated swelling with some giving way of the knees. She denies associated numbness or tingling.   The patient's symptoms have progressed to the point that they decrease her quality of life. The patient has previously undergone conservative treatment including NSAIDS and injections to the knee without adequate control of her symptoms.  Denies significant cardiac history, denies history of blood clots, denies known drug allergies.   Past Medical, Surgical, Family, Social History, Allergies, Medications:   Past Medical History:  Past Medical History:  Diagnosis Date  . Allergic rhinitis  . Arthritis  . Chronic low back pain  . Depression  . Diverticulosis 09/20/2014  Sigmoid colon  . Dyspepsia  . Essential hypertension, benign  . History of cervical cancer  . History of chicken pox  . History of migraine  . History of phlebitis  . Osteoporosis  . Other and unspecified hyperlipidemia  . Tubular adenoma of colon, unspecified 09/20/2014  . Unspecified hypothyroidism   Past Surgical History:  Past Surgical History:  Procedure Laterality Date  . COLONOSCOPY 09/20/2014  Tubular adenoma/Repeat 83yrs/PYO  . HYSTERECTOMY   Current  Medications:  Current Outpatient Medications  Medication Sig Dispense Refill  . atorvastatin (LIPITOR) 20 MG tablet Take 1 tablet (20 mg total) by mouth once daily 90 tablet 1  . cholecalciferol (CHOLECALCIFEROL) 1,000 unit tablet Take 1,000 Units by mouth once daily  . cyanocobalamin (VITAMIN B12) 1000 MCG tablet Take 1,000 mcg by mouth once daily.  . DOCOSAHEXANOIC ACID/EPA (FISH OIL ORAL) Take 1 capsule by mouth once daily  . levothyroxine (SYNTHROID) 50 MCG tablet TAKE 1 TABLET EVERY DAY 90 tablet 0  . lisinopriL-hydrochlorothiazide (ZESTORETIC) 10-12.5 mg tablet TAKE 1 TABLET EVERY DAY 90 tablet 0  . magnesium oxide (MAG-OX) 400 mg tablet Take 400 mg by mouth once daily  . omeprazole (PRILOSEC) 20 MG DR capsule TAKE 1 CAPSULE ONE TIME DAILY OR TWICE DAILY 180 capsule 0  . rOPINIRole (REQUIP) 0.25 MG tablet TAKE 1 TAB NIGHTLY. CAN INCREASE BY 1 TAB EVERY FOUR TO FIVE NIGHTS AS NEEDED (MUST HAVE APPOINTMENT PRIOR TO MORE REFILLS) 45 tablet 5  . diclofenac (VOLTAREN) 1 % topical gel Apply two grams to affected area up to four times daily PRN (Patient not taking: Reported on 07/26/2020 ) 200 g 1  . traMADoL (ULTRAM) 50 mg tablet Take 1 tablet (50 mg total) by mouth every 6 (six) hours as needed for Pain 30 tablet 0   No current facility-administered medications for this visit.   Allergies: No Known Allergies  Social History:  Social History   Socioeconomic History  . Marital status: Widowed  Spouse name: Not on file  . Number of children: Not on file  . Years of education: Not  on file  . Highest education level: Not on file  Occupational History  . Not on file  Tobacco Use  . Smoking status: Former Smoker  Quit date: 07/23/1969  Years since quitting: 51.1  . Smokeless tobacco: Never Used  Substance and Sexual Activity  . Alcohol use: No  . Drug use: No  . Sexual activity: Yes  Other Topics Concern  . Not on file  Social History Narrative  Exercise: 3 times a week for 30 minutes  at [employer].  Diet: Red meat 1-2 times a week, fast foods once a week, and fried foods occasionally.  Zostavax: September 2017  TDAP: 2014   Social Determinants of Health   Financial Resource Strain: Not on file  Food Insecurity: Not on file  Transportation Needs: Not on file  Physical Activity: Not on file  Stress: Not on file  Social Connections: Not on file  Housing Stability: Not on file   Family History: History reviewed. No pertinent family history.  Review of Systems:   A 10+ ROS was performed, reviewed, and the pertinent orthopaedic findings are documented in the HPI.   Physical Examination:   BP 120/70 (BP Location: Left upper arm, Patient Position: Sitting, BP Cuff Size: Adult)  Ht 165.1 cm (5\' 5" )  Wt 67.6 kg (149 lb)  BMI 24.79 kg/m   Patient is a well-developed, well-nourished female in no acute distress. Patient has normal mood and affect. Patient is alert and oriented to person, place, and time.   HEENT: Atraumatic, normocephalic. Pupils equal and reactive to light. Extraocular motion intact. Noninjected sclera.  Cardiovascular: Regular rate and rhythm, with no murmurs, rubs, or gallops. Distal pulses palpable.  Respiratory: Lungs clear to auscultation bilaterally.   Right Knee: Soft tissue swelling: moderate Effusion: minimal Erythema: none Crepitance: moderate Tenderness: parapatellar tenderness Alignment: normal Mediolateral laxity: stable Posterior sag: negative Patellar tracking: Good tracking without evidence of subluxation or tilt. Patellar grind test is positive. Atrophy: No significant atrophy.  Quadriceps tone was fair to good. Range of motion: 0/2/130 degrees  Sensation intact to light touch over the saphenous, lateral sural cutaneous, and deep fibular nerve distributions, slightly decreased over the superficial fibular distribution.  Tests Performed/Reviewed:  X-rays  Anteroposterior, lateral, and sunrise views of the right knee were  obtained. Images reveal mild loss of 2 compartment joint space with mild/moderate loss of lateral compartment joint space and associated osteophyte formation. Sunrise view reveals complete loss of patellofemoral joint space with significant osteophyte formation and bone-on-bone contact. No fractures or dislocations.  I personally ordered and interpreted today's x-rays.  Impression:   ICD-10-CM  1. Primary osteoarthritis of right knee M17.11  , severe  Plan:   The patient has end-stage degenerative changes of the right knee. It was explained to the patient that the condition is progressive in nature. Having failed conservative treatment, the patient has elected to proceed with a total joint arthroplasty. The patient will undergo a total joint arthroplasty with Dr. Marry Guan. The risks of surgery, including blood clot and infection, were discussed with the patient. Measures to reduce these risks, including the use of anticoagulation, perioperative antibiotics, and early ambulation were discussed. The importance of postoperative physical therapy was discussed with the patient. The patient elects to proceed with surgery. The patient is instructed to stop all blood thinners prior to surgery. The patient is instructed to call the hospital the day before surgery to learn of the proper arrival time.   Contact our office with any questions or concerns.  Follow up as indicated, or sooner should any new problems arise, if conditions worsen, or if they are otherwise concerned.   Gwenlyn Fudge, PA-C [facility] [place], [place] [address] Phone: [phone]  This note was generated in part with voice recognition software and I apologize for any typographical errors that were not detected and corrected.   Electronically signed by Gwenlyn Fudge, PA at 09/08/2020 9:03 AM EST

## 2020-09-12 ENCOUNTER — Ambulatory Visit: Payer: Medicare HMO

## 2020-09-12 ENCOUNTER — Ambulatory Visit
Admission: RE | Admit: 2020-09-12 | Discharge: 2020-09-12 | Disposition: A | Discharge status: Home / Self Care | Payer: Medicare HMO | Attending: Orthopedic Surgery | Admitting: Orthopedic Surgery

## 2020-09-12 ENCOUNTER — Ambulatory Visit: Payer: Medicare HMO | Admitting: Anesthesiology

## 2020-09-12 ENCOUNTER — Encounter
Admission: RE | Disposition: A | Discharge status: Home / Self Care | Payer: Self-pay | Source: Home / Self Care | Attending: Orthopedic Surgery

## 2020-09-12 ENCOUNTER — Other Ambulatory Visit: Payer: Self-pay

## 2020-09-12 ENCOUNTER — Encounter: Payer: Self-pay | Admitting: Orthopedic Surgery

## 2020-09-12 DIAGNOSIS — M1711 Unilateral primary osteoarthritis, right knee: Secondary | ICD-10-CM | POA: Insufficient documentation

## 2020-09-12 DIAGNOSIS — Z8541 Personal history of malignant neoplasm of cervix uteri: Secondary | ICD-10-CM | POA: Diagnosis not present

## 2020-09-12 DIAGNOSIS — Z87891 Personal history of nicotine dependence: Secondary | ICD-10-CM | POA: Diagnosis not present

## 2020-09-12 DIAGNOSIS — M810 Age-related osteoporosis without current pathological fracture: Secondary | ICD-10-CM | POA: Insufficient documentation

## 2020-09-12 DIAGNOSIS — Z79899 Other long term (current) drug therapy: Secondary | ICD-10-CM | POA: Insufficient documentation

## 2020-09-12 DIAGNOSIS — Z96659 Presence of unspecified artificial knee joint: Secondary | ICD-10-CM

## 2020-09-12 DIAGNOSIS — Z96651 Presence of right artificial knee joint: Secondary | ICD-10-CM

## 2020-09-12 HISTORY — PX: KNEE ARTHROPLASTY: SHX992

## 2020-09-12 HISTORY — PX: APPLICATION OF WOUND VAC: SHX5189

## 2020-09-12 LAB — ABO/RH: ABO/RH(D): O POS

## 2020-09-12 LAB — POTASSIUM: Potassium: 3.7 mmol/L (ref 3.5–5.1)

## 2020-09-12 SURGERY — ARTHROPLASTY, KNEE, TOTAL, USING IMAGELESS COMPUTER-ASSISTED NAVIGATION
Anesthesia: Spinal | Site: Knee | Laterality: Right

## 2020-09-12 MED ORDER — HYDROMORPHONE HCL 1 MG/ML IJ SOLN: 0.5000 mg | INTRAMUSCULAR | Status: DC | PRN

## 2020-09-12 MED ORDER — FLEET ENEMA 7-19 GM/118ML RE ENEM: 1.0000 | ENEMA | Freq: Once | RECTAL | Status: DC | PRN

## 2020-09-12 MED ORDER — SODIUM CHLORIDE 0.9 % IV SOLN: INTRAVENOUS | Status: DC

## 2020-09-12 MED ORDER — MAGNESIUM HYDROXIDE 400 MG/5ML PO SUSP: 30.0000 mL | Freq: Every day | ORAL | Status: DC

## 2020-09-12 MED ORDER — OXYCODONE HCL 5 MG PO TABS: 5.0000 mg | ORAL_TABLET | ORAL | 0 refills | Status: AC | PRN

## 2020-09-12 MED ORDER — CELECOXIB 200 MG PO CAPS
ORAL_CAPSULE | ORAL | Status: AC
  Administered 2020-09-12: 400 mg via ORAL
  Filled 2020-09-12: qty 2

## 2020-09-12 MED ORDER — EPHEDRINE SULFATE 50 MG/ML IJ SOLN
INTRAMUSCULAR | Status: DC | PRN
  Administered 2020-09-12: 10 mg via INTRAVENOUS

## 2020-09-12 MED ORDER — ONDANSETRON HCL 4 MG/2ML IJ SOLN: 4.0000 mg | Freq: Once | INTRAMUSCULAR | Status: DC | PRN

## 2020-09-12 MED ORDER — ACETAMINOPHEN 10 MG/ML IV SOLN
INTRAVENOUS | Status: AC
  Filled 2020-09-12: qty 100

## 2020-09-12 MED ORDER — TRAMADOL HCL 50 MG PO TABS: 50.0000 mg | ORAL_TABLET | ORAL | Status: DC | PRN

## 2020-09-12 MED ORDER — PHENYLEPHRINE HCL (PRESSORS) 10 MG/ML IV SOLN
INTRAVENOUS | Status: DC | PRN
  Administered 2020-09-12: 50 ug via INTRAVENOUS
  Administered 2020-09-12 (×2): 100 ug via INTRAVENOUS
  Administered 2020-09-12 (×2): 50 ug via INTRAVENOUS

## 2020-09-12 MED ORDER — SURGIPHOR WOUND IRRIGATION SYSTEM - OPTIME
TOPICAL | Status: DC | PRN
  Administered 2020-09-12: 450 mL via TOPICAL

## 2020-09-12 MED ORDER — CEFAZOLIN SODIUM-DEXTROSE 2-4 GM/100ML-% IV SOLN
INTRAVENOUS | Status: AC
  Filled 2020-09-12: qty 100

## 2020-09-12 MED ORDER — OXYCODONE HCL 5 MG PO TABS: 5.0000 mg | ORAL_TABLET | ORAL | Status: DC | PRN

## 2020-09-12 MED ORDER — FERROUS SULFATE 325 (65 FE) MG PO TABS
325.0000 mg | ORAL_TABLET | Freq: Two times a day (BID) | ORAL | Status: DC
  Filled 2020-09-12: qty 1

## 2020-09-12 MED ORDER — ENOXAPARIN SODIUM 40 MG/0.4ML ~~LOC~~ SOLN: 40.0000 mg | SUBCUTANEOUS | 0 refills | Status: AC

## 2020-09-12 MED ORDER — PHENOL 1.4 % MT LIQD
1.0000 | OROMUCOSAL | Status: DC | PRN
  Filled 2020-09-12: qty 177

## 2020-09-12 MED ORDER — ACETAMINOPHEN 325 MG PO TABS
325.0000 mg | ORAL_TABLET | Freq: Four times a day (QID) | ORAL | Status: DC | PRN
Start: 2020-09-13 — End: 2020-09-13

## 2020-09-12 MED ORDER — METOCLOPRAMIDE HCL 10 MG PO TABS: 10.0000 mg | ORAL_TABLET | Freq: Three times a day (TID) | ORAL | Status: DC

## 2020-09-12 MED ORDER — ONDANSETRON HCL 4 MG/2ML IJ SOLN: 4.0000 mg | Freq: Four times a day (QID) | INTRAMUSCULAR | Status: DC | PRN

## 2020-09-12 MED ORDER — OXYCODONE HCL 5 MG/5ML PO SOLN: 5.0000 mg | Freq: Once | ORAL | Status: AC | PRN

## 2020-09-12 MED ORDER — BUPIVACAINE HCL (PF) 0.25 % IJ SOLN
INTRAMUSCULAR | Status: DC | PRN
  Administered 2020-09-12: 60 mL

## 2020-09-12 MED ORDER — LACTATED RINGERS IV BOLUS
250.0000 mL | Freq: Once | INTRAVENOUS | Status: AC
  Administered 2020-09-12: 250 mL via INTRAVENOUS

## 2020-09-12 MED ORDER — TRANEXAMIC ACID-NACL 1000-0.7 MG/100ML-% IV SOLN
1000.0000 mg | Freq: Once | INTRAVENOUS | Status: AC
  Administered 2020-09-12: 1000 mg via INTRAVENOUS

## 2020-09-12 MED ORDER — CEFAZOLIN SODIUM-DEXTROSE 2-4 GM/100ML-% IV SOLN
INTRAVENOUS | Status: AC
  Administered 2020-09-12: 2000 mg
  Filled 2020-09-12: qty 100

## 2020-09-12 MED ORDER — ACETAMINOPHEN 10 MG/ML IV SOLN
INTRAVENOUS | Status: DC | PRN
  Administered 2020-09-12: 1000 mg via INTRAVENOUS

## 2020-09-12 MED ORDER — ENOXAPARIN SODIUM 40 MG/0.4ML ~~LOC~~ SOLN: 40.0000 mg | SUBCUTANEOUS | Status: DC

## 2020-09-12 MED ORDER — EPHEDRINE 5 MG/ML INJ
INTRAVENOUS | Status: AC
  Filled 2020-09-12: qty 10

## 2020-09-12 MED ORDER — PROPOFOL 500 MG/50ML IV EMUL
INTRAVENOUS | Status: AC
  Filled 2020-09-12: qty 50

## 2020-09-12 MED ORDER — BISACODYL 10 MG RE SUPP
10.0000 mg | Freq: Every day | RECTAL | Status: DC | PRN
  Filled 2020-09-12: qty 1

## 2020-09-12 MED ORDER — FENTANYL CITRATE (PF) 100 MCG/2ML IJ SOLN
INTRAMUSCULAR | Status: DC | PRN
  Administered 2020-09-12: 25 ug via INTRAVENOUS

## 2020-09-12 MED ORDER — FENTANYL CITRATE (PF) 100 MCG/2ML IJ SOLN
INTRAMUSCULAR | Status: AC
  Filled 2020-09-12: qty 2

## 2020-09-12 MED ORDER — NEOMYCIN-POLYMYXIN B GU 40-200000 IR SOLN
Status: DC | PRN
  Administered 2020-09-12: 16 mL

## 2020-09-12 MED ORDER — VANCOMYCIN HCL IN DEXTROSE 1-5 GM/200ML-% IV SOLN
INTRAVENOUS | Status: AC
  Administered 2020-09-12: 1000 mg via INTRAVENOUS
  Filled 2020-09-12: qty 200

## 2020-09-12 MED ORDER — SENNOSIDES-DOCUSATE SODIUM 8.6-50 MG PO TABS
1.0000 | ORAL_TABLET | Freq: Two times a day (BID) | ORAL | Status: DC
  Filled 2020-09-12: qty 1

## 2020-09-12 MED ORDER — CHLORHEXIDINE GLUCONATE 0.12 % MT SOLN
OROMUCOSAL | Status: AC
  Filled 2020-09-12: qty 15

## 2020-09-12 MED ORDER — OXYCODONE HCL 5 MG PO TABS
ORAL_TABLET | ORAL | Status: AC
  Filled 2020-09-12: qty 1

## 2020-09-12 MED ORDER — TRANEXAMIC ACID-NACL 1000-0.7 MG/100ML-% IV SOLN
INTRAVENOUS | Status: AC
  Filled 2020-09-12: qty 100

## 2020-09-12 MED ORDER — SODIUM CHLORIDE 0.9 % IV SOLN
INTRAVENOUS | Status: DC | PRN
  Administered 2020-09-12: 25 ug/min via INTRAVENOUS

## 2020-09-12 MED ORDER — OXYCODONE HCL 5 MG PO TABS
5.0000 mg | ORAL_TABLET | Freq: Once | ORAL | Status: AC | PRN
  Administered 2020-09-12: 5 mg via ORAL

## 2020-09-12 MED ORDER — GABAPENTIN 300 MG PO CAPS
ORAL_CAPSULE | ORAL | Status: AC
  Administered 2020-09-12: 300 mg via ORAL
  Filled 2020-09-12: qty 1

## 2020-09-12 MED ORDER — ONDANSETRON HCL 4 MG PO TABS: 4.0000 mg | ORAL_TABLET | Freq: Four times a day (QID) | ORAL | Status: DC | PRN

## 2020-09-12 MED ORDER — PROPOFOL 500 MG/50ML IV EMUL
INTRAVENOUS | Status: DC | PRN
  Administered 2020-09-12: 50 ug/kg/min via INTRAVENOUS

## 2020-09-12 MED ORDER — LACTATED RINGERS IV BOLUS
500.0000 mL | Freq: Once | INTRAVENOUS | Status: AC
  Administered 2020-09-12: 500 mL via INTRAVENOUS

## 2020-09-12 MED ORDER — PANTOPRAZOLE SODIUM 40 MG PO TBEC
40.0000 mg | DELAYED_RELEASE_TABLET | Freq: Two times a day (BID) | ORAL | Status: DC
  Filled 2020-09-12: qty 1

## 2020-09-12 MED ORDER — ACETAMINOPHEN 10 MG/ML IV SOLN: 1000.0000 mg | Freq: Four times a day (QID) | INTRAVENOUS | Status: DC

## 2020-09-12 MED ORDER — DIPHENHYDRAMINE HCL 12.5 MG/5ML PO ELIX
12.5000 mg | ORAL_SOLUTION | ORAL | Status: DC | PRN
  Filled 2020-09-12: qty 10

## 2020-09-12 MED ORDER — PROPOFOL 10 MG/ML IV BOLUS
INTRAVENOUS | Status: AC
  Filled 2020-09-12: qty 20

## 2020-09-12 MED ORDER — SODIUM CHLORIDE 0.9 % IV SOLN
INTRAVENOUS | Status: DC | PRN
  Administered 2020-09-12: 60 mL

## 2020-09-12 MED ORDER — DEXAMETHASONE SODIUM PHOSPHATE 10 MG/ML IJ SOLN
INTRAMUSCULAR | Status: AC
  Administered 2020-09-12: 8 mg via INTRAVENOUS
  Filled 2020-09-12: qty 1

## 2020-09-12 MED ORDER — FENTANYL CITRATE (PF) 100 MCG/2ML IJ SOLN: 25.0000 ug | INTRAMUSCULAR | Status: DC | PRN

## 2020-09-12 MED ORDER — CEFAZOLIN SODIUM-DEXTROSE 2-4 GM/100ML-% IV SOLN: 2.0000 g | Freq: Four times a day (QID) | INTRAVENOUS | Status: DC

## 2020-09-12 MED ORDER — CELECOXIB 200 MG PO CAPS: 200.0000 mg | ORAL_CAPSULE | Freq: Two times a day (BID) | ORAL | 2 refills | Status: AC

## 2020-09-12 MED ORDER — CELECOXIB 200 MG PO CAPS: 200.0000 mg | ORAL_CAPSULE | Freq: Two times a day (BID) | ORAL | Status: DC

## 2020-09-12 MED ORDER — MENTHOL 3 MG MT LOZG
1.0000 | LOZENGE | OROMUCOSAL | Status: DC | PRN
  Filled 2020-09-12: qty 9

## 2020-09-12 MED ORDER — ALUM & MAG HYDROXIDE-SIMETH 200-200-20 MG/5ML PO SUSP: 30.0000 mL | ORAL | Status: DC | PRN

## 2020-09-12 SURGICAL SUPPLY — 79 items
ATTUNE MED DOME PAT 32 KNEE (Knees) ×1 IMPLANT
ATTUNE PS FEM RT SZ 4 CEM KNEE (Femur) ×1 IMPLANT
ATTUNE PSRP INSR SZ4 8 KNEE (Insert) ×1 IMPLANT
BASEPLATE TIBIAL ROTATING SZ 4 (Knees) ×1 IMPLANT
BATTERY INSTRU NAVIGATION (MISCELLANEOUS) ×8 IMPLANT
BLADE SAW 70X12.5 (BLADE) ×2 IMPLANT
BLADE SAW 90X13X1.19 OSCILLAT (BLADE) ×2 IMPLANT
BLADE SAW 90X25X1.19 OSCILLAT (BLADE) ×2 IMPLANT
BONE CEMENT GENTAMICIN (Cement) ×4 IMPLANT
BSPLAT TIB 4 CMNT ROT PLAT STR (Knees) ×1 IMPLANT
BTRY SRG DRVR LF (MISCELLANEOUS) ×4
CANISTER PREVENA PLUS 150 (CANNISTER) ×2 IMPLANT
CANISTER SUCT 3000ML PPV (MISCELLANEOUS) ×2 IMPLANT
CEMENT BONE GENTAMICIN 40 (Cement) IMPLANT
COOLER POLAR GLACIER W/PUMP (MISCELLANEOUS) ×2 IMPLANT
COVER WAND RF STERILE (DRAPES) ×2 IMPLANT
CUFF TOURN SGL QUICK 24 (TOURNIQUET CUFF) ×2
CUFF TRNQT CYL 24X4X16.5-23 (TOURNIQUET CUFF) IMPLANT
DRAPE 3/4 80X56 (DRAPES) ×2 IMPLANT
DRESSING PEEL AND PLAC PRVNA20 (GAUZE/BANDAGES/DRESSINGS) ×1 IMPLANT
DRSG DERMACEA 8X12 NADH (GAUZE/BANDAGES/DRESSINGS) ×2 IMPLANT
DRSG MEPILEX SACRM 8.7X9.8 (GAUZE/BANDAGES/DRESSINGS) ×2 IMPLANT
DRSG OPSITE POSTOP 4X14 (GAUZE/BANDAGES/DRESSINGS) ×2 IMPLANT
DRSG PEEL AND PLACE PREVENA 20 (GAUZE/BANDAGES/DRESSINGS) ×2
DRSG TEGADERM 4X4.75 (GAUZE/BANDAGES/DRESSINGS) ×2 IMPLANT
DURAPREP 26ML APPLICATOR (WOUND CARE) ×4 IMPLANT
ELECT REM PT RETURN 9FT ADLT (ELECTROSURGICAL) ×2
ELECTRODE REM PT RTRN 9FT ADLT (ELECTROSURGICAL) ×1 IMPLANT
EX-PIN ORTHOLOCK NAV 4X150 (PIN) ×4 IMPLANT
GLOVE BIOGEL PI IND STRL 7.5 (GLOVE) ×1 IMPLANT
GLOVE BIOGEL PI INDICATOR 7.5 (GLOVE) ×1
GLOVE INDICATOR 8.0 STRL GRN (GLOVE) ×2 IMPLANT
GLOVE SURG ENC MOIS LTX SZ7.5 (GLOVE) ×4 IMPLANT
GLOVE SURG ENC TEXT LTX SZ7.5 (GLOVE) ×4 IMPLANT
GOWN STRL REUS W/ TWL LRG LVL3 (GOWN DISPOSABLE) ×2 IMPLANT
GOWN STRL REUS W/ TWL XL LVL3 (GOWN DISPOSABLE) ×1 IMPLANT
GOWN STRL REUS W/TWL LRG LVL3 (GOWN DISPOSABLE) ×4
GOWN STRL REUS W/TWL XL LVL3 (GOWN DISPOSABLE) ×2
HEMOVAC 400CC 10FR (MISCELLANEOUS) ×2 IMPLANT
HOLDER FOLEY CATH W/STRAP (MISCELLANEOUS) ×2 IMPLANT
HOOD PEEL AWAY FLYTE STAYCOOL (MISCELLANEOUS) ×4 IMPLANT
IRRIGATION SURGIPHOR STRL (IV SOLUTION) ×2 IMPLANT
KIT PUMP PREVENA PLUS 14DAY (MISCELLANEOUS) ×2 IMPLANT
KIT TURNOVER KIT A (KITS) ×2 IMPLANT
KNIFE SCULPS 14X20 (INSTRUMENTS) ×2 IMPLANT
LABEL OR SOLS (LABEL) ×2 IMPLANT
MANIFOLD NEPTUNE II (INSTRUMENTS) ×4 IMPLANT
NDL SAFETY ECLIPSE 18X1.5 (NEEDLE) ×1 IMPLANT
NDL SPNL 20GX3.5 QUINCKE YW (NEEDLE) ×2 IMPLANT
NEEDLE HYPO 18GX1.5 SHARP (NEEDLE) ×2
NEEDLE SPNL 20GX3.5 QUINCKE YW (NEEDLE) ×4 IMPLANT
NS IRRIG 500ML POUR BTL (IV SOLUTION) ×2 IMPLANT
PACK TOTAL KNEE (MISCELLANEOUS) ×2 IMPLANT
PAD WRAPON POLAR KNEE (MISCELLANEOUS) ×1 IMPLANT
PENCIL SMOKE EVACUATOR (MISCELLANEOUS) ×2 IMPLANT
PIN DRILL QUICK PACK ×2 IMPLANT
PIN FIXATION 1/8DIA X 3INL (PIN) ×6 IMPLANT
PULSAVAC PLUS IRRIG FAN TIP (DISPOSABLE) ×2
SOL .9 NS 3000ML IRR  AL (IV SOLUTION) ×1
SOL .9 NS 3000ML IRR AL (IV SOLUTION) ×1
SOL .9 NS 3000ML IRR UROMATIC (IV SOLUTION) ×1 IMPLANT
SOL PREP PVP 2OZ (MISCELLANEOUS) ×2
SOLUTION PREP PVP 2OZ (MISCELLANEOUS) ×1 IMPLANT
SPONGE DRAIN TRACH 4X4 STRL 2S (GAUZE/BANDAGES/DRESSINGS) ×2 IMPLANT
STAPLER SKIN PROX 35W (STAPLE) ×2 IMPLANT
STOCKINETTE IMPERV 14X48 (MISCELLANEOUS) IMPLANT
STRAP TIBIA SHORT (MISCELLANEOUS) ×2 IMPLANT
SUCTION FRAZIER HANDLE 10FR (MISCELLANEOUS) ×1
SUCTION TUBE FRAZIER 10FR DISP (MISCELLANEOUS) ×1 IMPLANT
SUT VIC AB 0 CT1 36 (SUTURE) ×4 IMPLANT
SUT VIC AB 1 CT1 36 (SUTURE) ×4 IMPLANT
SUT VIC AB 2-0 CT2 27 (SUTURE) ×2 IMPLANT
SYR 20ML LL LF (SYRINGE) ×2 IMPLANT
SYR 30ML LL (SYRINGE) ×4 IMPLANT
TIP FAN IRRIG PULSAVAC PLUS (DISPOSABLE) ×1 IMPLANT
TOWEL OR 17X26 4PK STRL BLUE (TOWEL DISPOSABLE) ×2 IMPLANT
TOWER CARTRIDGE SMART MIX (DISPOSABLE) ×2 IMPLANT
TRAY FOLEY MTR SLVR 16FR STAT (SET/KITS/TRAYS/PACK) ×2 IMPLANT
WRAPON POLAR PAD KNEE (MISCELLANEOUS) ×2

## 2020-09-12 NOTE — Anesthesia Procedure Notes (Signed)
Spinal  Patient location during procedure: OR Staffing Performed: resident/CRNA  Anesthesiologist: Tera Mater, MD Resident/CRNA: Fredderick Phenix, CRNA Preanesthetic Checklist Completed: patient identified, IV checked, site marked, risks and benefits discussed, surgical consent, monitors and equipment checked, pre-op evaluation and timeout performed Spinal Block Patient position: sitting Prep: DuraPrep Patient monitoring: heart rate, cardiac monitor, continuous pulse ox and blood pressure Approach: midline Location: L3-4 Injection technique: single-shot Needle Needle type: Sprotte  Needle gauge: 24 G Needle length: 9 cm Assessment Sensory level: T4

## 2020-09-12 NOTE — H&P (Signed)
The patient has been re-examined, and the chart reviewed, and there have been no interval changes to the documented history and physical.    The risks, benefits, and alternatives have been discussed at length. The patient expressed understanding of the risks benefits and agreed with plans for surgical intervention.  Meleane Selinger P. Kortnie Stovall, Jr. M.D.    

## 2020-09-12 NOTE — Transfer of Care (Signed)
Immediate Anesthesia Transfer of Care Note  Patient: Crystal Campbell  Procedure(s) Performed: COMPUTER ASSISTED TOTAL KNEE ARTHROPLASTY (Right Knee) APPLICATION OF WOUND VAC (Right Knee)  Patient Location: PACU  Anesthesia Type:Spinal  Level of Consciousness: awake  Airway & Oxygen Therapy: Patient Spontanous Breathing  Post-op Assessment: Report given to RN and Post -op Vital signs reviewed and stable  Post vital signs: Reviewed and stable  Last Vitals:  Vitals Value Taken Time  BP 88/57 09/12/20 1513  Temp 37.1 C 09/12/20 1514  Pulse 69 09/12/20 1515  Resp 11 09/12/20 1515  SpO2 95 % 09/12/20 1515  Vitals shown include unvalidated device data.  Last Pain:  Vitals:   09/12/20 1014  TempSrc: Tympanic  PainSc: 0-No pain         Complications: No complications documented.

## 2020-09-12 NOTE — Discharge Instructions (Signed)
AMBULATORY SURGERY  DISCHARGE INSTRUCTIONS   1) The drugs that you were given will stay in your system until tomorrow so for the next 24 hours you should not:  A) Drive an automobile B) Make any legal decisions C) Drink any alcoholic beverage   2) You may resume regular meals tomorrow.  Today it is better to start with liquids and gradually work up to solid foods.  You may eat anything you prefer, but it is better to start with liquids, then soup and crackers, and gradually work up to solid foods.   3) Please notify your doctor immediately if you have any unusual bleeding, trouble breathing, redness and pain at the surgery site, drainage, fever, or pain not relieved by medication.    4) Additional Instructions:        Please contact your physician with any problems or Same Day Surgery at [phone], Monday through Friday 6 am to 4 pm, or [place] at [facility] number at [phone].  Instructions after Total Knee Replacement   James P. Holley Bouche., M.D.     Dept. of [facility]  [place], [place]  [address]  Phone: [phone]   Fax: [phone]    DIET:  Drink plenty of non-alcoholic fluids.  Resume your normal diet. Include foods high in fiber.  ACTIVITY:   You may use crutches or a walker with weight-bearing as tolerated, unless instructed otherwise.  You may be weaned off of the walker or crutches by your Physical Therapist.   Do NOT place pillows under the knee. Anything placed under the knee could limit your ability to straighten the knee.    Continue doing gentle exercises. Exercising will reduce the pain and swelling, increase motion, and prevent muscle weakness.    Please continue to use the TED compression stockings for 6 weeks. You may remove the stockings at night, but should reapply them in the morning.  Do not drive or operate any equipment until instructed.  WOUND CARE:    Continue to use the PolarCare or ice packs periodically to reduce pain and swelling.  You may bathe or shower after the staples are removed at the first office visit following surgery.  MEDICATIONS:  You may resume your regular medications.  Please take the pain medication as prescribed on the medication.  Do not take pain medication on an empty stomach.  You have been given a prescription for a blood thinner (Lovenox or Coumadin). Please take the medication as instructed. (NOTE: After completing a 2 week course of Lovenox, take one Enteric-coated aspirin once a day. This along with elevation will help reduce the possibility of phlebitis in your operated leg.)  Do not drive or drink alcoholic beverages when taking pain medications.  CALL THE OFFICE FOR:  Temperature above 101 degrees  Excessive bleeding or drainage on the dressing.  Excessive swelling, coldness, or paleness of the toes.  Persistent nausea and vomiting.  FOLLOW-UP:   You should have an appointment to return to the office in 10-14 days after surgery.  Arrangements have been made for continuation of Physical Therapy (either home therapy or outpatient therapy).    [facility] Department Directory         [email or web address]       [email or web address]          Cardiology  Appointments: [place] - [phone]  Endocrinology  Appointments: [place] [phone] [place] [phone]  Gastroenterology  Appointments: [facility] [place] -  [place] [phone]        General Surgery   Appointments: [facility]  Internal Medicine/Family Medicine  Appointments: [facility] [place] - [phone] [place] [phone]  Metabolic and [place] Loss Surgery  Appointments: [facility]        Neurology  Appointments: [place] [phone] [place] - [phone]   Neurosurgery  Appointments: [place]  Obstetrics & Gynecology  Appointments: [place] [phone] [place] - [phone]        Pediatrics  Appointments: [place] [phone] [place] - [phone]  Physiatry  Appointments: [place] [phone]  Physical Therapy  Appointments: [facility] [place] [phone]        Podiatry  Appointments: [place] [phone] [place] - [phone]  Pulmonology  Appointments: [place]  Rheumatology  Appointments: [place] [phone]        [place] Location: [facility]  [place], [place]  [address]  [place] Location: [facility] [address] [place], [place]  [address]  [place] Location: [facility] [address] [place], [place]  [address]

## 2020-09-12 NOTE — Op Note (Signed)
OPERATIVE NOTE  DATE OF SURGERY:  09/12/2020  PATIENT NAME:  Crystal Campbell   DOB: 1940-10-15  MRN: [id number]  PRE-OPERATIVE DIAGNOSIS: Degenerative arthrosis of the right knee, primary  POST-OPERATIVE DIAGNOSIS:  Same  PROCEDURE:  Right total knee arthroplasty using computer-assisted navigation  SURGEON:  Marciano Sequin. M.D.  ASSISTANT: Cassell Smiles, PA-C (present and scrubbed throughout the case, critical for assistance with exposure, retraction, instrumentation, and closure)  ANESTHESIA: spinal  ESTIMATED BLOOD LOSS: 50 mL  FLUIDS REPLACED: 800 mL of crystalloid  TOURNIQUET TIME: 97 minutes  DRAINS: Wound VAC  SOFT TISSUE RELEASES: Anterior cruciate ligament, posterior cruciate ligament, deep medial collateral ligament, patellofemoral ligament  IMPLANTS UTILIZED: DePuy Attune size 4 posterior stabilized femoral component (cemented), size 4 rotating platform tibial component (cemented), 32 mm medialized dome patella (cemented), and a 7 mm stabilized rotating platform polyethylene insert.  INDICATIONS FOR SURGERY: TA FAIR is a 80 y.o. year old female with a long history of progressive knee pain. X-rays demonstrated severe degenerative changes in tricompartmental fashion. The patient had not seen any significant improvement despite conservative nonsurgical intervention. After discussion of the risks and benefits of surgical intervention, the patient expressed understanding of the risks benefits and agree with plans for total knee arthroplasty.   The risks, benefits, and alternatives were discussed at length including but not limited to the risks of infection, bleeding, nerve injury, stiffness, blood clots, the need for revision surgery, cardiopulmonary complications, among others, and they were willing to proceed.  PROCEDURE IN DETAIL: The patient was brought into the operating room and, after adequate spinal anesthesia was achieved, a tourniquet was placed on the  patient's upper thigh. The patient's knee and leg were cleaned and prepped with alcohol and DuraPrep and draped in the usual sterile fashion. A "timeout" was performed as per usual protocol. The lower extremity was exsanguinated using an Esmarch, and the tourniquet was inflated to 300 mmHg. An anterior longitudinal incision was made followed by a standard mid vastus approach. The deep fibers of the medial collateral ligament were elevated in a subperiosteal fashion off of the medial flare of the tibia so as to maintain a continuous soft tissue sleeve. The patella was subluxed laterally and the patellofemoral ligament was incised. Inspection of the knee demonstrated severe degenerative changes with full-thickness loss of articular cartilage. Osteophytes were debrided using a rongeur. Anterior and posterior cruciate ligaments were excised. Two 4.0 mm Schanz pins were inserted in the femur and into the tibia for attachment of the array of trackers used for computer-assisted navigation. Hip center was identified using a circumduction technique. Distal landmarks were mapped using the computer. The distal femur and proximal tibia were mapped using the computer. The distal femoral cutting guide was positioned using computer-assisted navigation so as to achieve a 5 distal valgus cut. The femur was sized and it was felt that a size 4 femoral component was appropriate. A size 4 femoral cutting guide was positioned and the anterior cut was performed and verified using the computer. This was followed by completion of the posterior and chamfer cuts. Femoral cutting guide for the central box was then positioned in the center box cut was performed.  Attention was then directed to the proximal tibia. Medial and lateral menisci were excised. The extramedullary tibial cutting guide was positioned using computer-assisted navigation so as to achieve a 0 varus-valgus alignment and 3 posterior slope. The cut was performed and  verified using the computer. The proximal tibia was sized and  it was felt that a size 4 tibial tray was appropriate. Tibial and femoral trials were inserted followed by insertion of a 7 mm polyethylene insert. This allowed for excellent mediolateral soft tissue balancing both in flexion and in full extension. Finally, the patella was cut and prepared so as to accommodate a 32 mm medialized dome patella. A patella trial was placed and the knee was placed through a range of motion with excellent patellar tracking appreciated. The femoral trial was removed after debridement of posterior osteophytes. The central post-hole for the tibial component was reamed followed by insertion of a keel punch. Tibial trials were then removed. Cut surfaces of bone were irrigated with copious amounts of normal saline using pulsatile lavage and then suctioned dry. Polymethylmethacrylate cement with gentamicin was prepared in the usual fashion using a vacuum mixer. Cement was applied to the cut surface of the proximal tibia as well as along the undersurface of a size 4 rotating platform tibial component. Tibial component was positioned and impacted into place. Excess cement was removed using [employer] fast streamer. Cement was then applied to the cut surfaces of the femur as well as along the posterior flanges of the size 4 femoral component. The femoral component was positioned and impacted into place. Excess cement was removed using [employer] fast streamer. A 7 mm polyethylene trial was inserted and the knee was brought into full extension with steady axial compression applied. Finally, cement was applied to the backside of a 32 mm medialized dome patella and the patellar component was positioned and patellar clamp applied. Excess cement was removed using [employer] fast streamer. After adequate curing of the cement, the tourniquet was deflated after a total tourniquet time of 97 minutes. Hemostasis was achieved using electrocautery. The knee was irrigated with  copious amounts of normal saline using pulsatile lavage followed by 500 ml of Surgiphor and then suctioned dry. 20 mL of 1.3% Exparel and 60 mL of 0.25% Marcaine in 40 mL of normal saline was injected along the posterior capsule, medial and lateral gutters, and along the arthrotomy site. A 7 mm stabilized rotating platform polyethylene insert was inserted and the knee was placed through a range of motion with excellent mediolateral soft tissue balancing appreciated and excellent patellar tracking noted. he medial parapatellar portion of the incision was reapproximated using interrupted sutures of #1 Vicryl. Subcutaneous tissue was approximated in layers using first #0 Vicryl followed #2-0 Vicryl. The skin was approximated with skin staples. A wound VAC and sterile dressing were applied.  The patient tolerated the procedure well and was transported to the recovery room in stable condition.    Media Pizzini P. Holley Bouche., M.D.

## 2020-09-12 NOTE — Evaluation (Signed)
Physical Therapy Evaluation Patient Details Name: Crystal Campbell MRN: [id number] DOB: [birth year] Today's Date: 09/12/2020   History of Present Illness  Pt admitted to [facility] on 09/12/20 for elective R TKA by Dr. Marry Guan and is WBAT on RLE. Significant PMH includes: cLBP, Depression, Anxiety, HTN, and hx cervical CA.    Clinical Impression  Pt is a 80 year old F admitted to hospital on 09/12/20 for R TKA and is WBAT. At baseline, pt was Ind with all ADL's, IADL's, community ambulation without AD, and driving. Pt presents with RLE weakness and limited AROM, fair standing balance, and increased pain levels secondary to acuity of sx, resulting in impaired functional mobility from baseline. Due to deficits, pt required mod I for bed mobility, CGA for transfers, and CGA for ambulation with RW. After visual demonstration of gait training with RW, pt able to progress to early reciprocal gait pattern. Pt demonstrates good safety awareness with all mobility, and was able to teach back all education provided. From PT perspective, pt is appropriate for DC home with HHPT, 24/7 supervision/assistance, and 3in1 for energy conservation and safety with functional mobility.      Follow Up Recommendations Home health PT;Supervision/Assistance - 24 hour    Equipment Recommendations  3in1 (PT)    Recommendations for Other Services       Precautions / Restrictions Precautions Precautions: Fall Restrictions Weight Bearing Restrictions: Yes RLE Weight Bearing: Weight bearing as tolerated      Mobility  Bed Mobility Overal bed mobility: Modified Independent             General bed mobility comments: Mod I for supine>sit transfer with HOB slightly elevated    Transfers Overall transfer level: Needs assistance   Transfers: Sit to/from Stand Sit to Stand: Min guard         General transfer comment: CGA for safety for STS transfers with RW; verbal cues for hand and RLE placement for pain management  and safety  Ambulation/Gait Ambulation/Gait assistance: Min guard Gait Distance (Feet): 150 Feet Assistive device: Rolling walker (2 wheeled)       General Gait Details: CGA for safety to ambulate with RW. Pt able to progress to early reciprocal gait pattern with decreased heel strike/toe off on RLE. Pt demonstrates good safety awareness.     Balance Overall balance assessment: Needs assistance   Sitting balance-Leahy Scale: Normal     Standing balance support: Bilateral upper extremity supported;During functional activity Standing balance-Leahy Scale: Fair                               Pertinent Vitals/Pain Pain Assessment: 0-10 Pain Score: 3  Pain Location: R knee Pain Intervention(s): Monitored during session;Repositioned;Ice applied    Home Living Family/patient expects to be discharged to:: Private residence Living Arrangements: Alone Available Help at Discharge: Family;Friend(s);Available 24 hours/day Type of Home: Mobile home Home Access: Level entry (level entry at back entrance)     Home Layout: One level Home Equipment: Walker - 2 wheels;Cane - single point;Wheelchair - manual      Prior Function Level of Independence: Independent         Comments: Pt was Ind with all ADL's, IADL's, community ambulation without AD, and driving.     Hand Dominance        Extremity/Trunk Assessment   Upper Extremity Assessment Upper Extremity Assessment: Overall WFL for tasks assessed    Lower Extremity Assessment Lower Extremity Assessment:  Overall WFL for tasks assessed;RLE deficits/detail RLE Deficits / Details: Secondary to acuity of sx; observed to be at least 3+/5 RLE Sensation: WNL RLE Coordination: WNL    Cervical / Trunk Assessment Cervical / Trunk Assessment: Kyphotic  Communication   Communication: No difficulties  Cognition Arousal/Alertness: Awake/alert Behavior During Therapy: WFL for tasks assessed/performed Overall Cognitive  Status: Within Functional Limits for tasks assessed                                 General Comments: A&O x4 and able to follow 100% of multistep commands      General Comments General comments (skin integrity, edema, etc.): RLE wrapped in ace bandage with wound vac and polar ice donned    Exercises Other Exercises Other Exercises: Pt able to participate in bed mobility, transfers, and ambulation with RW, requiring grossly mod I - CGA for safety. Pt demonstrates good safety awareness with all mobility. Verbal cues for hand and RLE placement during transfers for safety and pain management. Other Exercises: Pt educated regarding: PT role/POC, DC recommendations, gait training with RW, no pillow under knee, knee ext foam pillow, polar ice machine management, pain management at home, safety with mobility, and WB precautions. Pt able to teach back all education at end of session.   Assessment/Plan    PT Assessment All further PT needs can be met in the next venue of care  PT Problem List Decreased strength;Decreased mobility;Decreased range of motion;Decreased balance;Pain       PT Treatment Interventions      PT Goals (Current goals can be found in the Care Plan section)  Acute Rehab PT Goals Patient Stated Goal: to be Ind again PT Goal Formulation: With patient Time For Goal Achievement: 09/26/20 Potential to Achieve Goals: Fair     AM-PAC PT "6 Clicks" Mobility  Outcome Measure Help needed turning from your back to your side while in a flat bed without using bedrails?: None Help needed moving from lying on your back to sitting on the side of a flat bed without using bedrails?: None Help needed moving to and from a bed to a chair (including a wheelchair)?: A Little Help needed standing up from a chair using your arms (e.g., wheelchair or bedside chair)?: A Little Help needed to walk in hospital room?: A Little Help needed climbing 3-5 steps with a railing? : A  Little 6 Click Score: 20    End of Session Equipment Utilized During Treatment: Gait belt Activity Tolerance: Patient tolerated treatment well Patient left: in chair;with nursing/sitter in room Nurse Communication: Mobility status PT Visit Diagnosis: Unsteadiness on feet (R26.81)    Time: 6659-9357 PT Time Calculation (min) (ACUTE ONLY): 25 min   Charges:   PT Evaluation $PT Eval Low Complexity: 1 Low PT Treatments $Therapeutic Activity: 8-22 mins       Herminio Commons, PT, DPT 5:14 PM,09/12/20

## 2020-09-12 NOTE — Progress Notes (Signed)
Lovenox video , teaching done with pt and pts daughter

## 2020-09-12 NOTE — Anesthesia Preprocedure Evaluation (Addendum)
Anesthesia Evaluation  Patient identified by MRN, date of birth, ID band Patient awake    Reviewed: Allergy & Precautions, H&P , NPO status , Patient's Chart, lab work & pertinent test results  History of Anesthesia Complications Negative for: history of anesthetic complications  Airway Mallampati: II  TM Distance: >3 FB     Dental  (+) Edentulous Lower, Edentulous Upper   Pulmonary neg sleep apnea, neg COPD, former smoker,    breath sounds clear to auscultation       Cardiovascular hypertension, (-) angina(-) Past MI and (-) Cardiac Stents (-) dysrhythmias  Rhythm:regular Rate:Normal     Neuro/Psych negative neurological ROS  negative psych ROS   GI/Hepatic Neg liver ROS, GERD  ,  Endo/Other  Hypothyroidism   Renal/GU Renal disease (CKD)     Musculoskeletal   Abdominal   Peds  Hematology negative hematology ROS (+)   Anesthesia Other Findings Past Medical History: 1980: Cancer ([place])     Comment:  UTERINE CA No date: Chronic kidney disease     Comment:  stage 3 No date: Hyperlipidemia No date: Hypertension No date: Hypothyroidism No date: Osteoarthritis 05/2017: Skin cancer     Comment:  skin cancer removed (left side)  Past Surgical History: No date: ABDOMINAL HYSTERECTOMY No date: COLONOSCOPY     Reproductive/Obstetrics negative OB ROS                            Anesthesia Physical Anesthesia Plan  ASA: II  Anesthesia Plan: Spinal   Post-op Pain Management:    Induction:   PONV Risk Score and Plan: Propofol infusion and Ondansetron  Airway Management Planned:   Additional Equipment:   Intra-op Plan:   Post-operative Plan:   Informed Consent: I have reviewed the patients History and Physical, chart, labs and discussed the procedure including the risks, benefits and alternatives for the proposed anesthesia with the patient or authorized representative who has  indicated his/her understanding and acceptance.     Dental Advisory Given  Plan Discussed with: Anesthesiologist, CRNA and Surgeon  Anesthesia Plan Comments:         Anesthesia Quick Evaluation

## 2020-09-13 ENCOUNTER — Encounter: Payer: Self-pay | Admitting: Orthopedic Surgery

## 2020-09-13 DIAGNOSIS — E039 Hypothyroidism, unspecified: Secondary | ICD-10-CM | POA: Diagnosis not present

## 2020-09-13 DIAGNOSIS — I809 Phlebitis and thrombophlebitis of unspecified site: Secondary | ICD-10-CM | POA: Diagnosis not present

## 2020-09-13 DIAGNOSIS — M810 Age-related osteoporosis without current pathological fracture: Secondary | ICD-10-CM | POA: Diagnosis not present

## 2020-09-13 DIAGNOSIS — Z471 Aftercare following joint replacement surgery: Secondary | ICD-10-CM | POA: Diagnosis not present

## 2020-09-13 DIAGNOSIS — I10 Essential (primary) hypertension: Secondary | ICD-10-CM | POA: Diagnosis not present

## 2020-09-13 DIAGNOSIS — G8929 Other chronic pain: Secondary | ICD-10-CM | POA: Diagnosis not present

## 2020-09-13 DIAGNOSIS — M170 Bilateral primary osteoarthritis of knee: Secondary | ICD-10-CM | POA: Diagnosis not present

## 2020-09-13 DIAGNOSIS — M545 Low back pain, unspecified: Secondary | ICD-10-CM | POA: Diagnosis not present

## 2020-09-13 DIAGNOSIS — G43909 Migraine, unspecified, not intractable, without status migrainosus: Secondary | ICD-10-CM | POA: Diagnosis not present

## 2020-09-13 DIAGNOSIS — E7849 Other hyperlipidemia: Secondary | ICD-10-CM | POA: Diagnosis not present

## 2020-09-15 DIAGNOSIS — E7849 Other hyperlipidemia: Secondary | ICD-10-CM | POA: Diagnosis not present

## 2020-09-15 DIAGNOSIS — I10 Essential (primary) hypertension: Secondary | ICD-10-CM | POA: Diagnosis not present

## 2020-09-15 DIAGNOSIS — Z471 Aftercare following joint replacement surgery: Secondary | ICD-10-CM | POA: Diagnosis not present

## 2020-09-15 DIAGNOSIS — G8929 Other chronic pain: Secondary | ICD-10-CM | POA: Diagnosis not present

## 2020-09-15 DIAGNOSIS — G43909 Migraine, unspecified, not intractable, without status migrainosus: Secondary | ICD-10-CM | POA: Diagnosis not present

## 2020-09-15 DIAGNOSIS — M810 Age-related osteoporosis without current pathological fracture: Secondary | ICD-10-CM | POA: Diagnosis not present

## 2020-09-15 DIAGNOSIS — E039 Hypothyroidism, unspecified: Secondary | ICD-10-CM | POA: Diagnosis not present

## 2020-09-15 DIAGNOSIS — M545 Low back pain, unspecified: Secondary | ICD-10-CM | POA: Diagnosis not present

## 2020-09-15 DIAGNOSIS — I809 Phlebitis and thrombophlebitis of unspecified site: Secondary | ICD-10-CM | POA: Diagnosis not present

## 2020-09-15 NOTE — Anesthesia Postprocedure Evaluation (Signed)
Anesthesia Post Note  Patient: Crystal Campbell  Procedure(s) Performed: COMPUTER ASSISTED TOTAL KNEE ARTHROPLASTY (Right Knee) APPLICATION OF WOUND VAC (Right Knee)  Patient location during evaluation: Other Anesthesia Type: Spinal Level of consciousness: awake and alert Pain management: pain level controlled Respiratory status: spontaneous breathing and nonlabored ventilation Cardiovascular status: stable Postop Assessment: no apparent nausea or vomiting, spinal receding and no headache Anesthetic complications: no   No complications documented.   Last Vitals:  Vitals:   09/12/20 1811 09/12/20 1900  BP: (!) 130/59 121/60  Pulse:  66  Resp: 14   Temp: (!) 36.2 C   SpO2: 99% 98%    Last Pain:  Vitals:   09/13/20 0839  TempSrc:   PainSc: [place]

## 2020-09-19 DIAGNOSIS — M545 Low back pain, unspecified: Secondary | ICD-10-CM | POA: Diagnosis not present

## 2020-09-19 DIAGNOSIS — I10 Essential (primary) hypertension: Secondary | ICD-10-CM | POA: Diagnosis not present

## 2020-09-19 DIAGNOSIS — E7849 Other hyperlipidemia: Secondary | ICD-10-CM | POA: Diagnosis not present

## 2020-09-19 DIAGNOSIS — Z471 Aftercare following joint replacement surgery: Secondary | ICD-10-CM | POA: Diagnosis not present

## 2020-09-19 DIAGNOSIS — I809 Phlebitis and thrombophlebitis of unspecified site: Secondary | ICD-10-CM | POA: Diagnosis not present

## 2020-09-19 DIAGNOSIS — M810 Age-related osteoporosis without current pathological fracture: Secondary | ICD-10-CM | POA: Diagnosis not present

## 2020-09-19 DIAGNOSIS — E039 Hypothyroidism, unspecified: Secondary | ICD-10-CM | POA: Diagnosis not present

## 2020-09-19 DIAGNOSIS — G43909 Migraine, unspecified, not intractable, without status migrainosus: Secondary | ICD-10-CM | POA: Diagnosis not present

## 2020-09-19 DIAGNOSIS — G8929 Other chronic pain: Secondary | ICD-10-CM | POA: Diagnosis not present

## 2020-09-20 DIAGNOSIS — Z471 Aftercare following joint replacement surgery: Secondary | ICD-10-CM | POA: Diagnosis not present

## 2020-09-21 DIAGNOSIS — G43909 Migraine, unspecified, not intractable, without status migrainosus: Secondary | ICD-10-CM | POA: Diagnosis not present

## 2020-09-21 DIAGNOSIS — M545 Low back pain, unspecified: Secondary | ICD-10-CM | POA: Diagnosis not present

## 2020-09-21 DIAGNOSIS — G8929 Other chronic pain: Secondary | ICD-10-CM | POA: Diagnosis not present

## 2020-09-21 DIAGNOSIS — I10 Essential (primary) hypertension: Secondary | ICD-10-CM | POA: Diagnosis not present

## 2020-09-21 DIAGNOSIS — E7849 Other hyperlipidemia: Secondary | ICD-10-CM | POA: Diagnosis not present

## 2020-09-21 DIAGNOSIS — Z471 Aftercare following joint replacement surgery: Secondary | ICD-10-CM | POA: Diagnosis not present

## 2020-09-21 DIAGNOSIS — M810 Age-related osteoporosis without current pathological fracture: Secondary | ICD-10-CM | POA: Diagnosis not present

## 2020-09-21 DIAGNOSIS — E039 Hypothyroidism, unspecified: Secondary | ICD-10-CM | POA: Diagnosis not present

## 2020-09-21 DIAGNOSIS — I809 Phlebitis and thrombophlebitis of unspecified site: Secondary | ICD-10-CM | POA: Diagnosis not present

## 2020-09-23 DIAGNOSIS — I809 Phlebitis and thrombophlebitis of unspecified site: Secondary | ICD-10-CM | POA: Diagnosis not present

## 2020-09-23 DIAGNOSIS — M810 Age-related osteoporosis without current pathological fracture: Secondary | ICD-10-CM | POA: Diagnosis not present

## 2020-09-23 DIAGNOSIS — M545 Low back pain, unspecified: Secondary | ICD-10-CM | POA: Diagnosis not present

## 2020-09-23 DIAGNOSIS — Z471 Aftercare following joint replacement surgery: Secondary | ICD-10-CM | POA: Diagnosis not present

## 2020-09-23 DIAGNOSIS — G43909 Migraine, unspecified, not intractable, without status migrainosus: Secondary | ICD-10-CM | POA: Diagnosis not present

## 2020-09-23 DIAGNOSIS — E039 Hypothyroidism, unspecified: Secondary | ICD-10-CM | POA: Diagnosis not present

## 2020-09-23 DIAGNOSIS — E7849 Other hyperlipidemia: Secondary | ICD-10-CM | POA: Diagnosis not present

## 2020-09-23 DIAGNOSIS — G8929 Other chronic pain: Secondary | ICD-10-CM | POA: Diagnosis not present

## 2020-09-23 DIAGNOSIS — I10 Essential (primary) hypertension: Secondary | ICD-10-CM | POA: Diagnosis not present

## 2020-09-26 DIAGNOSIS — M545 Low back pain, unspecified: Secondary | ICD-10-CM | POA: Diagnosis not present

## 2020-09-26 DIAGNOSIS — I809 Phlebitis and thrombophlebitis of unspecified site: Secondary | ICD-10-CM | POA: Diagnosis not present

## 2020-09-26 DIAGNOSIS — E039 Hypothyroidism, unspecified: Secondary | ICD-10-CM | POA: Diagnosis not present

## 2020-09-26 DIAGNOSIS — E7849 Other hyperlipidemia: Secondary | ICD-10-CM | POA: Diagnosis not present

## 2020-09-26 DIAGNOSIS — M810 Age-related osteoporosis without current pathological fracture: Secondary | ICD-10-CM | POA: Diagnosis not present

## 2020-09-26 DIAGNOSIS — G43909 Migraine, unspecified, not intractable, without status migrainosus: Secondary | ICD-10-CM | POA: Diagnosis not present

## 2020-09-26 DIAGNOSIS — G8929 Other chronic pain: Secondary | ICD-10-CM | POA: Diagnosis not present

## 2020-09-26 DIAGNOSIS — Z471 Aftercare following joint replacement surgery: Secondary | ICD-10-CM | POA: Diagnosis not present

## 2020-09-26 DIAGNOSIS — I10 Essential (primary) hypertension: Secondary | ICD-10-CM | POA: Diagnosis not present

## 2020-09-27 DIAGNOSIS — Z96651 Presence of right artificial knee joint: Secondary | ICD-10-CM | POA: Diagnosis not present

## 2020-09-27 DIAGNOSIS — G8929 Other chronic pain: Secondary | ICD-10-CM | POA: Diagnosis not present

## 2020-09-27 DIAGNOSIS — M25561 Pain in right knee: Secondary | ICD-10-CM | POA: Diagnosis not present

## 2020-09-27 DIAGNOSIS — M6281 Muscle weakness (generalized): Secondary | ICD-10-CM | POA: Diagnosis not present

## 2020-09-27 DIAGNOSIS — M25661 Stiffness of right knee, not elsewhere classified: Secondary | ICD-10-CM | POA: Diagnosis not present

## 2020-09-30 DIAGNOSIS — M6281 Muscle weakness (generalized): Secondary | ICD-10-CM | POA: Diagnosis not present

## 2020-09-30 DIAGNOSIS — M25561 Pain in right knee: Secondary | ICD-10-CM | POA: Diagnosis not present

## 2020-09-30 DIAGNOSIS — G8929 Other chronic pain: Secondary | ICD-10-CM | POA: Diagnosis not present

## 2020-09-30 DIAGNOSIS — M25661 Stiffness of right knee, not elsewhere classified: Secondary | ICD-10-CM | POA: Diagnosis not present

## 2020-09-30 DIAGNOSIS — Z96651 Presence of right artificial knee joint: Secondary | ICD-10-CM | POA: Diagnosis not present

## 2020-10-03 DIAGNOSIS — M25561 Pain in right knee: Secondary | ICD-10-CM | POA: Diagnosis not present

## 2020-10-03 DIAGNOSIS — Z96651 Presence of right artificial knee joint: Secondary | ICD-10-CM | POA: Diagnosis not present

## 2020-10-03 DIAGNOSIS — M25661 Stiffness of right knee, not elsewhere classified: Secondary | ICD-10-CM | POA: Diagnosis not present

## 2020-10-03 DIAGNOSIS — G8929 Other chronic pain: Secondary | ICD-10-CM | POA: Diagnosis not present

## 2020-10-03 DIAGNOSIS — M6281 Muscle weakness (generalized): Secondary | ICD-10-CM | POA: Diagnosis not present

## 2020-10-06 DIAGNOSIS — G8929 Other chronic pain: Secondary | ICD-10-CM | POA: Diagnosis not present

## 2020-10-06 DIAGNOSIS — Z96651 Presence of right artificial knee joint: Secondary | ICD-10-CM | POA: Diagnosis not present

## 2020-10-06 DIAGNOSIS — M25661 Stiffness of right knee, not elsewhere classified: Secondary | ICD-10-CM | POA: Diagnosis not present

## 2020-10-06 DIAGNOSIS — M25561 Pain in right knee: Secondary | ICD-10-CM | POA: Diagnosis not present

## 2020-10-06 DIAGNOSIS — M6281 Muscle weakness (generalized): Secondary | ICD-10-CM | POA: Diagnosis not present

## 2020-10-10 ENCOUNTER — Other Ambulatory Visit: Payer: Self-pay | Admitting: Family Medicine

## 2020-10-10 DIAGNOSIS — Z1231 Encounter for screening mammogram for malignant neoplasm of breast: Secondary | ICD-10-CM

## 2020-10-11 DIAGNOSIS — M25561 Pain in right knee: Secondary | ICD-10-CM | POA: Diagnosis not present

## 2020-10-11 DIAGNOSIS — Z96651 Presence of right artificial knee joint: Secondary | ICD-10-CM | POA: Diagnosis not present

## 2020-10-11 DIAGNOSIS — G8929 Other chronic pain: Secondary | ICD-10-CM | POA: Diagnosis not present

## 2020-10-11 DIAGNOSIS — M6281 Muscle weakness (generalized): Secondary | ICD-10-CM | POA: Diagnosis not present

## 2020-10-11 DIAGNOSIS — M25661 Stiffness of right knee, not elsewhere classified: Secondary | ICD-10-CM | POA: Diagnosis not present

## 2020-10-13 DIAGNOSIS — M25561 Pain in right knee: Secondary | ICD-10-CM | POA: Diagnosis not present

## 2020-10-13 DIAGNOSIS — G8929 Other chronic pain: Secondary | ICD-10-CM | POA: Diagnosis not present

## 2020-10-13 DIAGNOSIS — M25661 Stiffness of right knee, not elsewhere classified: Secondary | ICD-10-CM | POA: Diagnosis not present

## 2020-10-13 DIAGNOSIS — Z96651 Presence of right artificial knee joint: Secondary | ICD-10-CM | POA: Diagnosis not present

## 2020-10-13 DIAGNOSIS — M6281 Muscle weakness (generalized): Secondary | ICD-10-CM | POA: Diagnosis not present

## 2020-10-24 DIAGNOSIS — E785 Hyperlipidemia, unspecified: Secondary | ICD-10-CM | POA: Diagnosis not present

## 2020-10-24 DIAGNOSIS — I10 Essential (primary) hypertension: Secondary | ICD-10-CM | POA: Diagnosis not present

## 2020-10-24 DIAGNOSIS — E039 Hypothyroidism, unspecified: Secondary | ICD-10-CM | POA: Diagnosis not present

## 2020-10-24 DIAGNOSIS — M2550 Pain in unspecified joint: Secondary | ICD-10-CM | POA: Diagnosis not present

## 2020-10-24 DIAGNOSIS — K219 Gastro-esophageal reflux disease without esophagitis: Secondary | ICD-10-CM | POA: Diagnosis not present

## 2020-10-25 DIAGNOSIS — Z96651 Presence of right artificial knee joint: Secondary | ICD-10-CM | POA: Diagnosis not present

## 2020-10-27 ENCOUNTER — Ambulatory Visit
Admission: RE | Admit: 2020-10-27 | Discharge: 2020-10-27 | Disposition: A | Discharge status: Home / Self Care | Payer: Medicare HMO | Source: Ambulatory Visit | Attending: Family Medicine | Admitting: Family Medicine

## 2020-10-27 ENCOUNTER — Other Ambulatory Visit: Payer: Self-pay

## 2020-10-27 DIAGNOSIS — Z1231 Encounter for screening mammogram for malignant neoplasm of breast: Secondary | ICD-10-CM | POA: Insufficient documentation

## 2020-12-05 DIAGNOSIS — I10 Essential (primary) hypertension: Secondary | ICD-10-CM | POA: Diagnosis not present

## 2021-01-12 DIAGNOSIS — Z86010 Personal history of colonic polyps: Secondary | ICD-10-CM | POA: Diagnosis not present

## 2021-01-27 DIAGNOSIS — H40003 Preglaucoma, unspecified, bilateral: Secondary | ICD-10-CM | POA: Diagnosis not present

## 2021-03-02 ENCOUNTER — Encounter: Payer: Self-pay | Admitting: *Deleted

## 2021-03-03 ENCOUNTER — Encounter: Payer: Self-pay | Admitting: *Deleted

## 2021-03-03 ENCOUNTER — Encounter
Admission: RE | Disposition: A | Discharge status: Home / Self Care | Payer: Self-pay | Source: Home / Self Care | Attending: Gastroenterology

## 2021-03-03 ENCOUNTER — Ambulatory Visit: Payer: Medicare HMO | Admitting: Anesthesiology

## 2021-03-03 ENCOUNTER — Ambulatory Visit
Admission: RE | Admit: 2021-03-03 | Discharge: 2021-03-03 | Disposition: A | Discharge status: Home / Self Care | Payer: Medicare HMO | Attending: Gastroenterology | Admitting: Gastroenterology

## 2021-03-03 ENCOUNTER — Other Ambulatory Visit: Payer: Self-pay

## 2021-03-03 DIAGNOSIS — Z79890 Hormone replacement therapy (postmenopausal): Secondary | ICD-10-CM | POA: Insufficient documentation

## 2021-03-03 DIAGNOSIS — Z9049 Acquired absence of other specified parts of digestive tract: Secondary | ICD-10-CM | POA: Diagnosis not present

## 2021-03-03 DIAGNOSIS — Z7901 Long term (current) use of anticoagulants: Secondary | ICD-10-CM | POA: Diagnosis not present

## 2021-03-03 DIAGNOSIS — Z79899 Other long term (current) drug therapy: Secondary | ICD-10-CM | POA: Diagnosis not present

## 2021-03-03 DIAGNOSIS — I129 Hypertensive chronic kidney disease with stage 1 through stage 4 chronic kidney disease, or unspecified chronic kidney disease: Secondary | ICD-10-CM | POA: Diagnosis not present

## 2021-03-03 DIAGNOSIS — E039 Hypothyroidism, unspecified: Secondary | ICD-10-CM | POA: Diagnosis not present

## 2021-03-03 DIAGNOSIS — N183 Chronic kidney disease, stage 3 unspecified: Secondary | ICD-10-CM | POA: Insufficient documentation

## 2021-03-03 DIAGNOSIS — Z8541 Personal history of malignant neoplasm of cervix uteri: Secondary | ICD-10-CM | POA: Diagnosis not present

## 2021-03-03 DIAGNOSIS — K5730 Diverticulosis of large intestine without perforation or abscess without bleeding: Secondary | ICD-10-CM | POA: Insufficient documentation

## 2021-03-03 DIAGNOSIS — Z90710 Acquired absence of both cervix and uterus: Secondary | ICD-10-CM | POA: Insufficient documentation

## 2021-03-03 DIAGNOSIS — Z85828 Personal history of other malignant neoplasm of skin: Secondary | ICD-10-CM | POA: Insufficient documentation

## 2021-03-03 DIAGNOSIS — K648 Other hemorrhoids: Secondary | ICD-10-CM | POA: Diagnosis not present

## 2021-03-03 DIAGNOSIS — E785 Hyperlipidemia, unspecified: Secondary | ICD-10-CM | POA: Insufficient documentation

## 2021-03-03 DIAGNOSIS — Z86010 Personal history of colonic polyps: Secondary | ICD-10-CM | POA: Diagnosis not present

## 2021-03-03 DIAGNOSIS — K635 Polyp of colon: Secondary | ICD-10-CM | POA: Insufficient documentation

## 2021-03-03 DIAGNOSIS — Z1211 Encounter for screening for malignant neoplasm of colon: Secondary | ICD-10-CM | POA: Diagnosis not present

## 2021-03-03 HISTORY — DX: Age-related osteoporosis without current pathological fracture: M81.0

## 2021-03-03 HISTORY — PX: COLONOSCOPY WITH PROPOFOL: SHX5780

## 2021-03-03 SURGERY — COLONOSCOPY WITH PROPOFOL
Anesthesia: General

## 2021-03-03 MED ORDER — FENTANYL CITRATE (PF) 100 MCG/2ML IJ SOLN
INTRAMUSCULAR | Status: DC | PRN
  Administered 2021-03-03 (×4): 25 ug via INTRAVENOUS

## 2021-03-03 MED ORDER — PROPOFOL 500 MG/50ML IV EMUL
INTRAVENOUS | Status: DC | PRN
  Administered 2021-03-03: 50 ug/kg/min via INTRAVENOUS

## 2021-03-03 MED ORDER — EPHEDRINE SULFATE 50 MG/ML IJ SOLN
INTRAMUSCULAR | Status: DC | PRN
  Administered 2021-03-03: 10 mg via INTRAVENOUS
  Administered 2021-03-03: 5 mg via INTRAVENOUS

## 2021-03-03 MED ORDER — PROPOFOL 10 MG/ML IV BOLUS
INTRAVENOUS | Status: DC | PRN
  Administered 2021-03-03: 20 mg via INTRAVENOUS
  Administered 2021-03-03: 10 mg via INTRAVENOUS
  Administered 2021-03-03: 20 mg via INTRAVENOUS

## 2021-03-03 MED ORDER — EPHEDRINE 5 MG/ML INJ
INTRAVENOUS | Status: AC
  Filled 2021-03-03: qty 5

## 2021-03-03 MED ORDER — LIDOCAINE HCL (CARDIAC) PF 100 MG/5ML IV SOSY
PREFILLED_SYRINGE | INTRAVENOUS | Status: DC | PRN
  Administered 2021-03-03: 50 mg via INTRAVENOUS

## 2021-03-03 MED ORDER — FENTANYL CITRATE (PF) 100 MCG/2ML IJ SOLN
INTRAMUSCULAR | Status: AC
  Filled 2021-03-03: qty 2

## 2021-03-03 MED ORDER — SODIUM CHLORIDE 0.9 % IV SOLN: INTRAVENOUS | Status: DC

## 2021-03-03 NOTE — Anesthesia Preprocedure Evaluation (Signed)
Anesthesia Evaluation  Patient identified by MRN, date of birth, ID band Patient awake    Reviewed: Allergy & Precautions, NPO status , Patient's Chart, lab work & pertinent test results  History of Anesthesia Complications Negative for: history of anesthetic complications  Airway Mallampati: I  TM Distance: >3 FB Neck ROM: Full    Dental  (+) Edentulous Upper, Edentulous Lower   Pulmonary former smoker,    Pulmonary exam normal        Cardiovascular hypertension, Pt. on medications Normal cardiovascular examI     Neuro/Psych    GI/Hepatic GERD  Controlled and Medicated,  Endo/Other    Renal/GU      Musculoskeletal   Abdominal Normal abdominal exam  (+)   Peds  Hematology   Anesthesia Other Findings   Reproductive/Obstetrics                             Anesthesia Physical Anesthesia Plan  ASA: 2  Anesthesia Plan: General   Post-op Pain Management:    Induction: Intravenous  PONV Risk Score and Plan:   Airway Management Planned: Simple Face Mask  Additional Equipment: None  Intra-op Plan:   Post-operative Plan:   Informed Consent: I have reviewed the patients History and Physical, chart, labs and discussed the procedure including the risks, benefits and alternatives for the proposed anesthesia with the patient or authorized representative who has indicated his/her understanding and acceptance.       Plan Discussed with: CRNA  Anesthesia Plan Comments:         Anesthesia Quick Evaluation

## 2021-03-03 NOTE — Op Note (Signed)
[facility] Patient Name: Crystal Campbell Procedure Date: 03/03/2021 10:59 AM MRN: [id number] Account #: [id number] Date of Birth: June 11, 1941 Admit Type: Outpatient Age: 80 Room: [facility] ENDO ROOM 1 Gender: Female Note Status: Finalized Procedure:             Colonoscopy Indications:           High risk colon cancer surveillance: Personal history                         of colonic polyps Providers:             Annamaria Helling DO, DO Medicines:             Monitored Anesthesia Care Complications:         No immediate complications. Estimated blood loss: None. Procedure:             Pre-Anesthesia Assessment:                        - Prior to the procedure, a History and Physical was                         performed, and patient medications and allergies were                         reviewed. The patient's tolerance of previous                         anesthesia was also reviewed. The risks and benefits                         of the procedure and the sedation options and risks                         were discussed with the patient. All questions were                         answered, and informed consent was obtained. Prior                         Anticoagulants: The patient has taken no previous                         anticoagulant or antiplatelet agents. ASA Grade                         Assessment: II - A patient with mild systemic disease.                         After reviewing the risks and benefits, the patient                         was deemed in satisfactory condition to undergo the                         procedure.                        - Prior to the procedure, a History and Physical was  performed, and patient medications and allergies were                         reviewed. The patient is competent. The risks and                         benefits of the procedure and the sedation options and                          risks were discussed with the patient. All questions                         were answered and informed consent was obtained.                         Patient identification and proposed procedure were                         verified by the physician, the nurse, the anesthetist                         and the technician in the endoscopy suite. Mental                         Status Examination: alert and oriented. Airway                         Examination: normal oropharyngeal airway and neck                         mobility. Respiratory Examination: clear to                         auscultation. CV Examination: normal. Prophylactic                         Antibiotics: The patient does not require prophylactic                         antibiotics. Prior Anticoagulants: The patient has                         taken no previous anticoagulant or antiplatelet                         agents. ASA Grade Assessment: II - A patient with mild                         systemic disease. After reviewing the risks and                         benefits, the patient was deemed in satisfactory                         condition to undergo the procedure. The anesthesia                         plan was to use monitored anesthesia care (MAC).  Immediately prior to administration of medications,                         the patient was re-assessed for adequacy to receive                         sedatives. The heart rate, respiratory rate, oxygen                         saturations, blood pressure, adequacy of pulmonary                         ventilation, and response to care were monitored                         throughout the procedure. The physical status of the                         patient was re-assessed after the procedure.                        After obtaining informed consent, the colonoscope was                         passed under direct vision. Throughout the procedure,                          the patient's blood pressure, pulse, and oxygen                         saturations were monitored continuously. The                         Colonoscope was introduced through the anus and                         advanced to the the cecum, identified by appendiceal                         orifice and ileocecal valve. The quality of the bowel                         preparation was good. The entire colon was examined.                         The quality of the bowel preparation was evaluated                         using the BBPS [facility] Bowel Preparation Scale) with                         scores of: Right Colon = 2 (minor amount of residual                         staining, small fragments of stool and/or opaque                         liquid, but mucosa seen well),  Transverse Colon = 2                         (minor amount of residual staining, small fragments of                         stool and/or opaque liquid, but mucosa seen well) and                         Left Colon = 3 (entire mucosa seen well with no                         residual staining, small fragments of stool or opaque                         liquid). The total BBPS score equals 7. The quality of                         the bowel preparation was good. The ileocecal valve,                         appendiceal orifice, and rectum were photographed. Findings:      A 3 mm polyp was found in the cecum. The polyp was sessile. The polyp       was removed with a cold biopsy forceps. Resection and retrieval were       complete.      The perianal and digital rectal examinations were normal. Pertinent       negatives include normal sphincter tone.      A few small-mouthed diverticula were found in the sigmoid colon.       Estimated blood loss: none. Estimated blood loss: none. Impression:            - One 3 mm polyp in the cecum, removed with a cold                         biopsy forceps. Resected and  retrieved.                        - Diverticulosis in the sigmoid colon.                        - Hemorrhoids. Recommendation:        - Repeat colonoscopy is not recommended. No further                         screening/surveillance colonoscopy needed unless                         otherwise indicated.                        - Resume previous diet. Procedure Code(s):     --- Professional ---                        [phone], Colonoscopy, flexible; with biopsy, single or                         multiple Diagnosis  Code(s):     --- Professional ---                        Z86.010, Personal history of colonic polyps                        K63.5, Polyp of colon                        K64.9, Unspecified hemorrhoids                        K57.30, Diverticulosis of large intestine without                         perforation or abscess without bleeding CPT copyright 2019 American Medical Association. All rights reserved. The codes documented in this report are preliminary and upon coder review may  be revised to meet current compliance requirements. Attending Participation:      I personally performed the entire procedure. Volney American, DO Annamaria Helling DO, DO 03/03/2021 11:50:21 AM This report has been signed electronically. Number of Addenda: 0 Note Initiated On: 03/03/2021 10:59 AM Scope Withdrawal Time: 0 hours 17 minutes 1 second  Total Procedure Duration: 0 hours 32 minutes 59 seconds  Estimated Blood Loss:  Estimated blood loss was minimal.      [facility]

## 2021-03-03 NOTE — Interval H&P Note (Signed)
History and Physical Interval Note: Preprocedure H&P from 03/03/21 was reviewed and there was no interval change after seeing and examining the patient.  Written consent was obtained from the patient after discussion of risks, benefits, and alternatives. Patient has consented to proceed with colonoscopy with possible intervention   03/03/2021 10:58 AM  [name]  has presented today for surgery, with the diagnosis of HX ADEN POLYP.  The various methods of treatment have been discussed with the patient and family. After consideration of risks, benefits and other options for treatment, the patient has consented to  Procedure(s) with comments: COLONOSCOPY WITH PROPOFOL (N/A) - PER OFFICE - NEEDS AMPICILLIN as a surgical intervention.  The patient's history has been reviewed, patient examined, no change in status, stable for surgery.  I have reviewed the patient's chart and labs.  Questions were answered to the patient's satisfaction.     Crystal Campbell

## 2021-03-03 NOTE — H&P (Signed)
[facility] Pre-Procedure H&P   Patient ID: Crystal Campbell is a 80 y.o. female.  Gastroenterology Provider: Annamaria Helling, DO  Referring Provider: Stephens November, NP PCP: Juluis Pitch, MD  Date: 03/03/2021  HPI Crystal Campbell is a 80 y.o. female who presents today for colonoscopy for 5 year surveillance for colon polyps. Patient's last colonoscopy in 2016 with Dr. Candace Cruise demonstrating 2 small adenomas. BM have been regular with no blood. Pt has history of hysterectomy Patient denies nausea, vomiting, coffee ground emesis, hematemesis, abdominal pain, diarrhea, constipation, melena, hematochezia, fever, chills, dysphagia, odynophagia, jaundice, heartburn/reflux.    Past Medical History:  Diagnosis Date   Cancer ([place]) 1980   UTERINE CA   Chronic kidney disease    stage 3   Hyperlipidemia    Hypertension    Hypothyroidism    Osteoarthritis    Osteoporosis    Skin cancer 05/2017   skin cancer removed (left side)    Past Surgical History:  Procedure Laterality Date   ABDOMINAL HYSTERECTOMY     APPLICATION OF WOUND VAC Right 09/12/2020   Procedure: APPLICATION OF WOUND VAC;  Surgeon: Dereck Leep, MD;  Location: [facility] ORS;  Service: Orthopedics;  Laterality: Right;  [place]   CHOLECYSTECTOMY     COLONOSCOPY     KNEE ARTHROPLASTY Right 09/12/2020   Procedure: COMPUTER ASSISTED TOTAL KNEE ARTHROPLASTY;  Surgeon: Dereck Leep, MD;  Location: [facility] ORS;  Service: Orthopedics;  Laterality: Right;    Family History No h/o GI disease or malignancy  Review of Systems  Constitutional:  Negative for activity change, appetite change, fatigue and fever.  HENT:  Negative for trouble swallowing and voice change.   Respiratory:  Negative for shortness of breath and wheezing.   Cardiovascular:  Negative for chest pain and palpitations.  Gastrointestinal:  Negative for abdominal distention, abdominal pain, anal bleeding, blood in stool, constipation,  diarrhea, nausea, rectal pain and vomiting.  Musculoskeletal:  Negative for arthralgias and myalgias.  Skin:  Negative for color change and pallor.  Neurological:  Negative for dizziness, syncope and weakness.  Psychiatric/Behavioral:  Negative for confusion.   All other systems reviewed and are negative.   Medications No current facility-administered medications on file prior to encounter.   Current Outpatient Medications on File Prior to Encounter  Medication Sig Dispense Refill   atorvastatin (LIPITOR) 20 MG tablet Take 20 mg by mouth daily.     celecoxib (CELEBREX) 200 MG capsule Take 1 capsule (200 mg total) by mouth 2 (two) times daily. 60 capsule 2   levothyroxine (SYNTHROID) 50 MCG tablet Take 50 mcg by mouth daily before breakfast.     lisinopril-hydrochlorothiazide (ZESTORETIC) 10-12.5 MG tablet Take 1 tablet by mouth daily.     omeprazole (PRILOSEC) 20 MG capsule Take 20 mg by mouth every evening.     rOPINIRole (REQUIP) 0.25 MG tablet Take 0.25 mg by mouth in the morning, at noon, and at bedtime.     cholecalciferol (VITAMIN D3) 25 MCG (1000 UNIT) tablet Take 1,000 Units by mouth daily.     diclofenac Sodium (VOLTAREN) 1 % GEL Apply 1 application topically 4 (four) times daily as needed for pain. (Patient not taking: Reported on 03/03/2021)     enoxaparin (LOVENOX) 40 MG/0.4ML injection Inject 0.4 mLs (40 mg total) into the skin daily. 5.6 mL 0   magnesium oxide (MAG-OX) 400 MG tablet Take 400 mg by mouth daily.     Omega-3 Fatty Acids (FISH OIL) 1000 MG CAPS Take  1,000 mg by mouth daily.     oxyCODONE (ROXICODONE) 5 MG immediate release tablet Take 1-2 tablets (5-10 mg total) by mouth every 4 (four) hours as needed for severe pain. (Patient not taking: Reported on 03/03/2021) 30 tablet 0   traMADol (ULTRAM) 50 MG tablet Take 1 tablet by mouth every 6 (six) hours as needed for pain. (Patient not taking: No sig reported)     vitamin B-12 (CYANOCOBALAMIN) 1000 MCG tablet Take 1,000  mcg by mouth daily.      Pertinent medications related to GI and procedure were reviewed by me with the patient prior to the procedure   Current Facility-Administered Medications:    0.9 %  sodium chloride infusion, , Intravenous, Continuous, Annamaria Helling, DO, Last Rate: 20 mL/hr at 03/03/21 1009, New Bag at 03/03/21 1009  sodium chloride 20 mL/hr at 03/03/21 1009       No Known Allergies Allergies were reviewed by me prior to the procedure  Objective    Vitals:   03/03/21 0957  BP: (!) 146/64  Pulse: 73  Resp: 20  Temp: (!) 96.8 F (36 C)  TempSrc: Temporal  SpO2: 100%  Weight: 68 kg  Height: '5\' 5"'$  (1.651 m)     Physical Exam Vitals and nursing note reviewed.  Constitutional:      General: She is not in acute distress.    Appearance: Normal appearance. She is not ill-appearing, toxic-appearing or diaphoretic.  HENT:     Head: Normocephalic and atraumatic.     Nose: Nose normal.     Mouth/Throat:     Mouth: Mucous membranes are moist.     Pharynx: Oropharynx is clear.  Eyes:     General: No scleral icterus.    Extraocular Movements: Extraocular movements intact.  Cardiovascular:     Rate and Rhythm: Normal rate and regular rhythm.     Heart sounds: Normal heart sounds. No murmur heard.   No friction rub. No gallop.  Pulmonary:     Effort: Pulmonary effort is normal. No respiratory distress.     Breath sounds: Normal breath sounds. No wheezing, rhonchi or rales.  Abdominal:     General: Abdomen is flat. Bowel sounds are normal. There is no distension.     Palpations: Abdomen is soft.     Tenderness: There is no abdominal tenderness. There is no guarding or rebound.  Musculoskeletal:     Cervical back: Neck supple.     Right lower leg: No edema.     Left lower leg: No edema.  Skin:    General: Skin is warm and dry.     Coloration: Skin is not jaundiced or pale.  Neurological:     General: No focal deficit present.     Mental Status: She is  alert and oriented to person, place, and time. Mental status is at baseline.  Psychiatric:        Mood and Affect: Mood normal.        Behavior: Behavior normal.        Thought Content: Thought content normal.        Judgment: Judgment normal.     Assessment:  Ms. Crystal Campbell is a 80 y.o. female  who presents today for colonoscopy for personal h/o colon polyps.  Plan:  colonoscopy with possible intervention today Colonoscopy with possible biopsy, control of bleeding, polypectomy, and interventions as necessary has been discussed with the patient/patient representative. Informed consent was obtained from the patient/patient representative after explaining  the indication, nature, and risks of the procedure including but not limited to death, bleeding, perforation, missed neoplasm/lesions, cardiorespiratory compromise, and reaction to medications. Opportunity for questions was given and appropriate answers were provided. Patient/patient representative has verbalized understanding is amenable to undergoing the procedure.   Annamaria Helling, DO  [facility]  Portions of the record may have been created with voice recognition software. Occasional wrong-word or 'sound-a-like' substitutions may have occurred due to the inherent limitations of voice recognition software.  Read the chart carefully and recognize, using context, where substitutions may have occurred.

## 2021-03-03 NOTE — Transfer of Care (Signed)
Immediate Anesthesia Transfer of Care Note  Patient: Crystal Campbell  Procedure(s) Performed: COLONOSCOPY WITH PROPOFOL  Patient Location: PACU  Anesthesia Type:General  Level of Consciousness: sedated  Airway & Oxygen Therapy: Patient Spontanous Breathing  Post-op Assessment: Report given to RN and Post -op Vital signs reviewed and stable  Post vital signs: Reviewed and stable  Last Vitals:  Vitals Value Taken Time  BP 105/53 03/03/21 1144  Temp    Pulse    Resp 15 03/03/21 1145  SpO2    Vitals shown include unvalidated device data.  Last Pain:  Vitals:   03/03/21 0957  TempSrc: Temporal  PainSc: 0-No pain         Complications: No notable events documented.

## 2021-03-03 NOTE — Anesthesia Postprocedure Evaluation (Signed)
Anesthesia Post Note  Patient: Crystal Campbell  Procedure(s) Performed: COLONOSCOPY WITH PROPOFOL  Patient location during evaluation: PACU Anesthesia Type: General Level of consciousness: awake and alert Pain management: pain level controlled Vital Signs Assessment: post-procedure vital signs reviewed and stable Respiratory status: spontaneous breathing, nonlabored ventilation, respiratory function stable and patient connected to nasal cannula oxygen Cardiovascular status: blood pressure returned to baseline and stable Postop Assessment: no apparent nausea or vomiting Anesthetic complications: no   No notable events documented.   Last Vitals:  Vitals:   03/03/21 1144 03/03/21 1154  BP: (!) 105/53   Pulse:    Resp:    Temp: (!) 35.7 C   SpO2: 100% 100%    Last Pain:  Vitals:   03/03/21 1204  TempSrc:   PainSc: 0-No pain                 Nishika Parkhurst Doyne Keel

## 2021-03-06 ENCOUNTER — Encounter: Payer: Self-pay | Admitting: Gastroenterology

## 2021-03-07 LAB — SURGICAL PATHOLOGY

## 2021-05-08 DIAGNOSIS — I10 Essential (primary) hypertension: Secondary | ICD-10-CM | POA: Diagnosis not present

## 2021-05-08 DIAGNOSIS — E785 Hyperlipidemia, unspecified: Secondary | ICD-10-CM | POA: Diagnosis not present

## 2021-05-08 DIAGNOSIS — E039 Hypothyroidism, unspecified: Secondary | ICD-10-CM | POA: Diagnosis not present

## 2021-05-11 DIAGNOSIS — E785 Hyperlipidemia, unspecified: Secondary | ICD-10-CM | POA: Diagnosis not present

## 2021-05-11 DIAGNOSIS — Z87891 Personal history of nicotine dependence: Secondary | ICD-10-CM | POA: Diagnosis not present

## 2021-05-11 DIAGNOSIS — K219 Gastro-esophageal reflux disease without esophagitis: Secondary | ICD-10-CM | POA: Diagnosis not present

## 2021-05-11 DIAGNOSIS — M810 Age-related osteoporosis without current pathological fracture: Secondary | ICD-10-CM | POA: Diagnosis not present

## 2021-05-11 DIAGNOSIS — N189 Chronic kidney disease, unspecified: Secondary | ICD-10-CM | POA: Diagnosis not present

## 2021-05-11 DIAGNOSIS — E039 Hypothyroidism, unspecified: Secondary | ICD-10-CM | POA: Diagnosis not present

## 2021-05-11 DIAGNOSIS — Z0000 Encounter for general adult medical examination without abnormal findings: Secondary | ICD-10-CM | POA: Diagnosis not present

## 2021-05-11 DIAGNOSIS — I129 Hypertensive chronic kidney disease with stage 1 through stage 4 chronic kidney disease, or unspecified chronic kidney disease: Secondary | ICD-10-CM | POA: Diagnosis not present

## 2021-07-14 IMAGING — MG MM DIGITAL SCREENING BILAT W/ TOMO AND CAD
8 series · 8 of 24 positions shown · non-contrast
Comparison: Previous exam(s).

CLINICAL DATA: Screening.

EXAM:
DIGITAL SCREENING BILATERAL MAMMOGRAM WITH TOMOSYNTHESIS AND CAD
TECHNIQUE: Bilateral screening digital craniocaudal and mediolateral oblique
mammograms were obtained. Bilateral screening digital breast
tomosynthesis was performed. The images were evaluated with
computer-aided detection.

[L CC synth-2D]
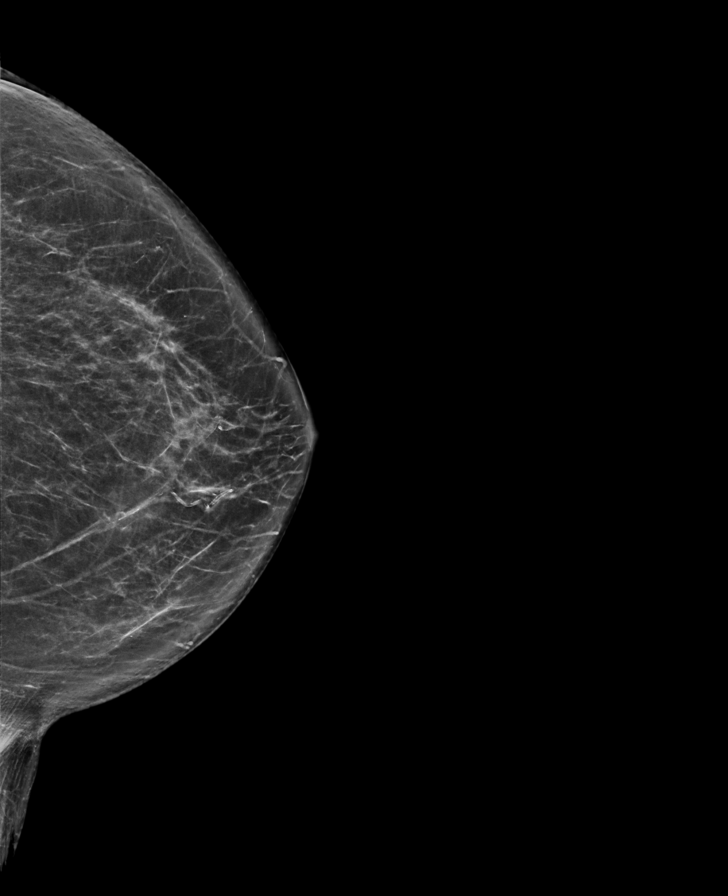

[R MLO synth-2D]
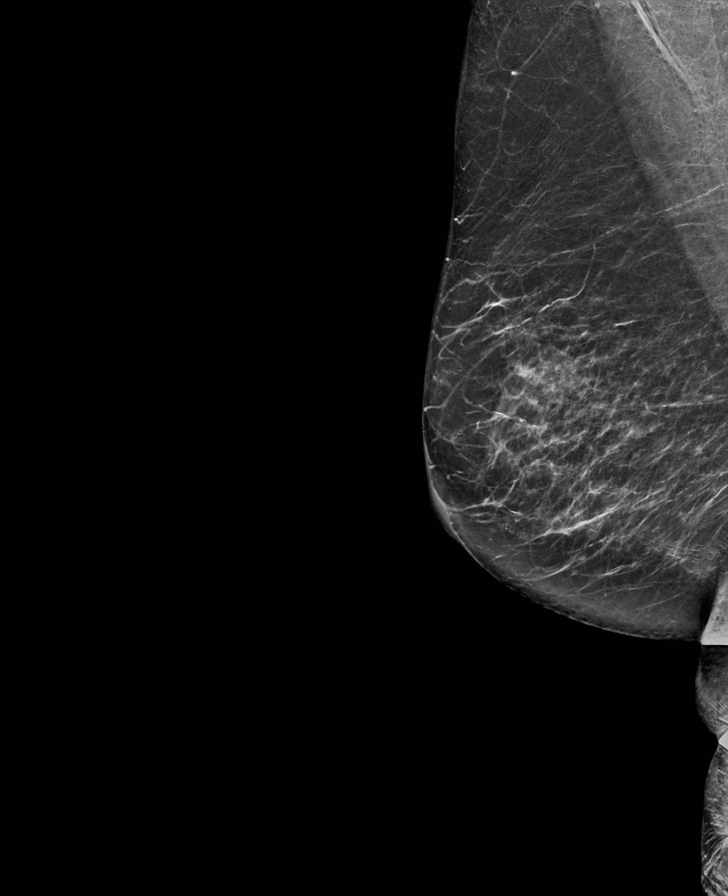

[L MLO synth-2D]
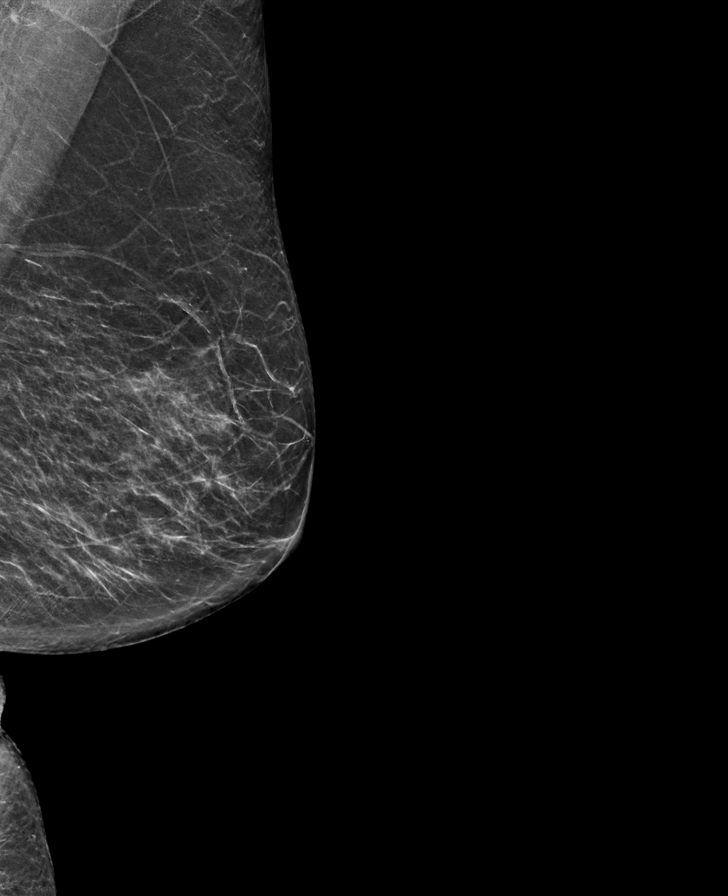

[R CC synth-2D]
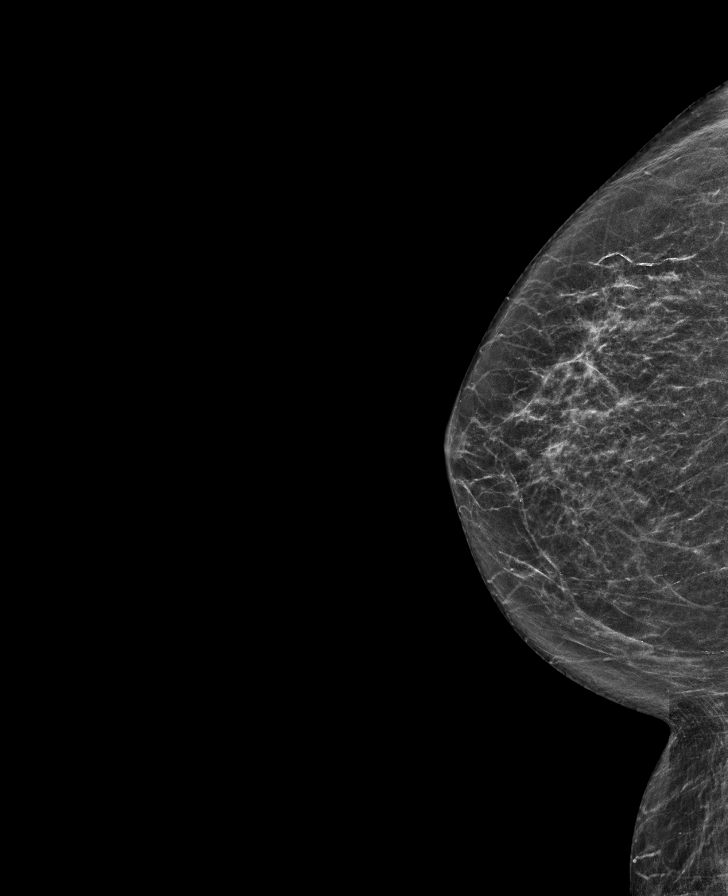

[R MLO tomo · tomo slice 38/75.0]
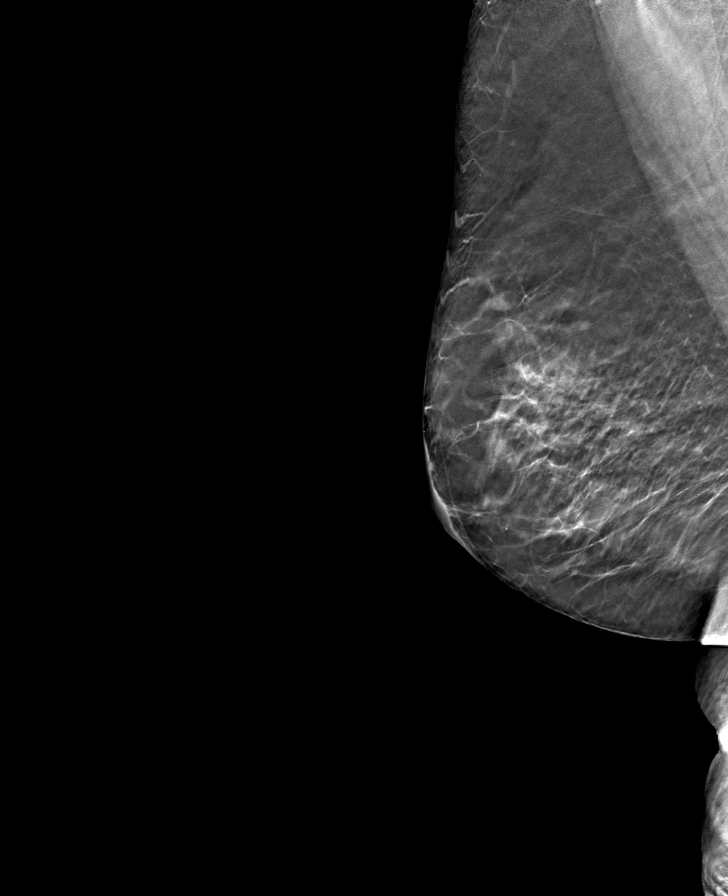

[L CC tomo · tomo slice 37/74.0]
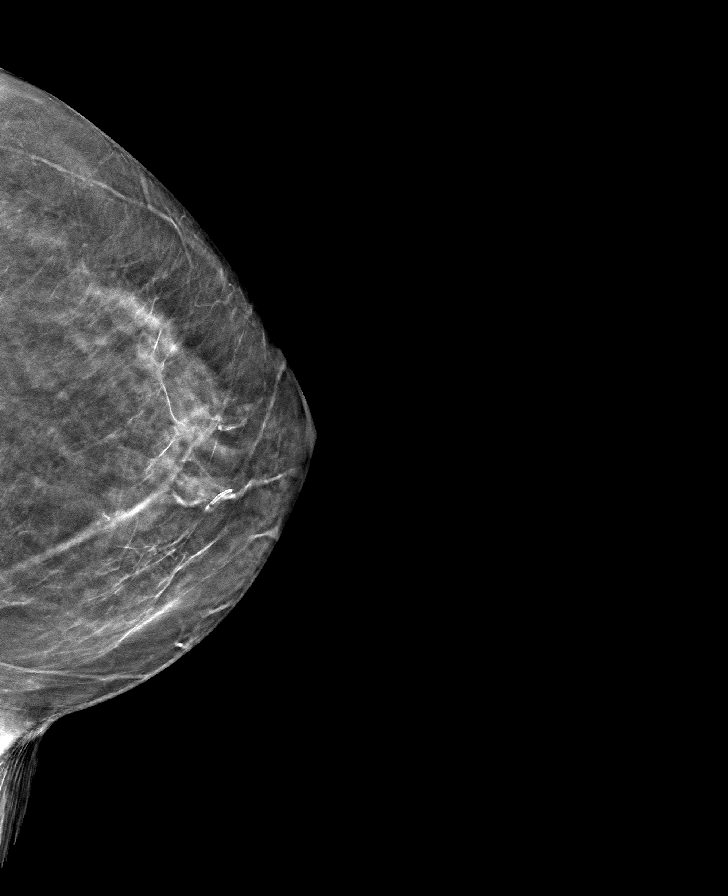

[L MLO tomo · tomo slice 33/66.0]
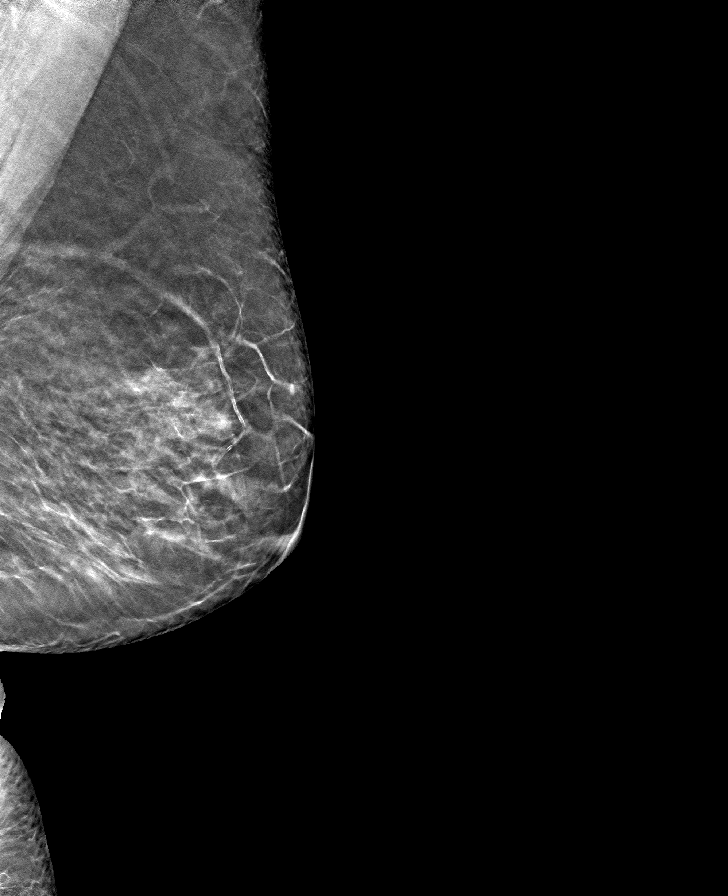

[R CC tomo · tomo slice 30/59.0]
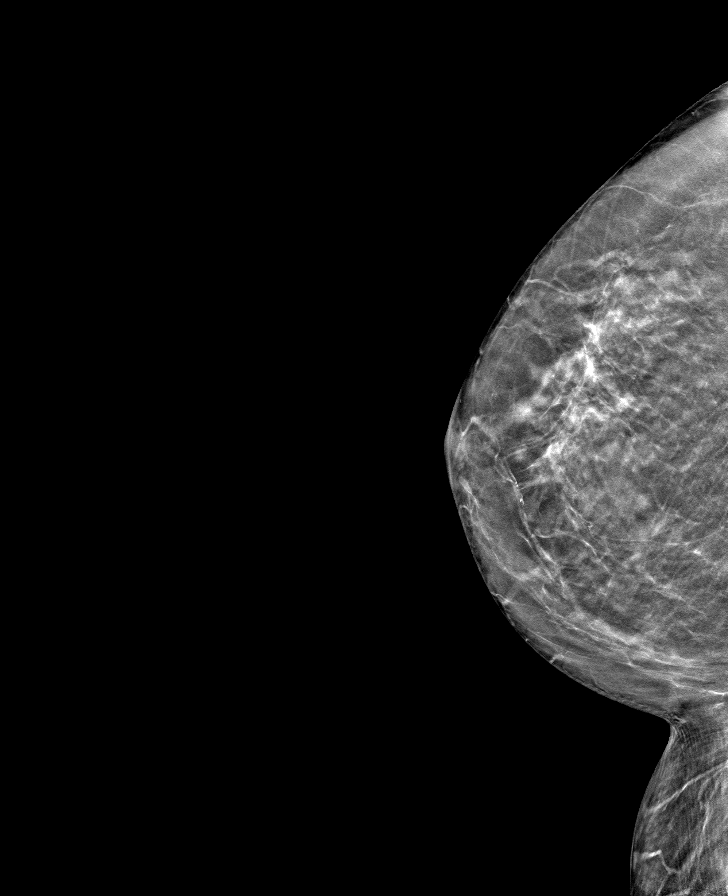

[8 of 24 positions shown; findings below may reference images not displayed]

ACR Breast Density Category b: There are scattered areas of
fibroglandular density.
FINDINGS: There are no findings suspicious for malignancy. The images were
evaluated with computer-aided detection.
IMPRESSION: No mammographic evidence of malignancy. A result letter of this
screening mammogram will be mailed directly to the patient.

RECOMMENDATION:
Screening mammogram in one year. (Code:WJ-I-BG6)

BI-RADS CATEGORY  1: Negative.

## 2021-09-12 DIAGNOSIS — Z96651 Presence of right artificial knee joint: Secondary | ICD-10-CM | POA: Diagnosis not present

## 2021-09-12 DIAGNOSIS — M8588 Other specified disorders of bone density and structure, other site: Secondary | ICD-10-CM | POA: Diagnosis not present

## 2021-09-18 ENCOUNTER — Other Ambulatory Visit: Payer: Self-pay | Admitting: Family Medicine

## 2021-09-18 DIAGNOSIS — Z1231 Encounter for screening mammogram for malignant neoplasm of breast: Secondary | ICD-10-CM

## 2021-10-30 ENCOUNTER — Ambulatory Visit
Admission: RE | Admit: 2021-10-30 | Discharge: 2021-10-30 | Disposition: A | Discharge status: Home / Self Care | Payer: Medicare HMO | Source: Ambulatory Visit | Attending: Family Medicine | Admitting: Family Medicine

## 2021-10-30 DIAGNOSIS — Z1231 Encounter for screening mammogram for malignant neoplasm of breast: Secondary | ICD-10-CM | POA: Diagnosis not present

## 2021-11-09 DIAGNOSIS — I10 Essential (primary) hypertension: Secondary | ICD-10-CM | POA: Diagnosis not present

## 2021-11-09 DIAGNOSIS — I129 Hypertensive chronic kidney disease with stage 1 through stage 4 chronic kidney disease, or unspecified chronic kidney disease: Secondary | ICD-10-CM | POA: Diagnosis not present

## 2021-11-09 DIAGNOSIS — Z87891 Personal history of nicotine dependence: Secondary | ICD-10-CM | POA: Diagnosis not present

## 2021-11-09 DIAGNOSIS — M810 Age-related osteoporosis without current pathological fracture: Secondary | ICD-10-CM | POA: Diagnosis not present

## 2021-11-09 DIAGNOSIS — N1832 Chronic kidney disease, stage 3b: Secondary | ICD-10-CM | POA: Diagnosis not present

## 2021-11-09 DIAGNOSIS — E039 Hypothyroidism, unspecified: Secondary | ICD-10-CM | POA: Diagnosis not present

## 2022-02-21 DIAGNOSIS — H353131 Nonexudative age-related macular degeneration, bilateral, early dry stage: Secondary | ICD-10-CM | POA: Diagnosis not present

## 2022-05-18 DIAGNOSIS — Z0000 Encounter for general adult medical examination without abnormal findings: Secondary | ICD-10-CM | POA: Diagnosis not present

## 2022-05-18 DIAGNOSIS — I129 Hypertensive chronic kidney disease with stage 1 through stage 4 chronic kidney disease, or unspecified chronic kidney disease: Secondary | ICD-10-CM | POA: Diagnosis not present

## 2022-05-18 DIAGNOSIS — E785 Hyperlipidemia, unspecified: Secondary | ICD-10-CM | POA: Diagnosis not present

## 2022-05-18 DIAGNOSIS — I10 Essential (primary) hypertension: Secondary | ICD-10-CM | POA: Diagnosis not present

## 2022-05-18 DIAGNOSIS — E039 Hypothyroidism, unspecified: Secondary | ICD-10-CM | POA: Diagnosis not present

## 2022-05-18 DIAGNOSIS — M810 Age-related osteoporosis without current pathological fracture: Secondary | ICD-10-CM | POA: Diagnosis not present

## 2022-05-18 DIAGNOSIS — Z23 Encounter for immunization: Secondary | ICD-10-CM | POA: Diagnosis not present

## 2022-05-18 DIAGNOSIS — Z1331 Encounter for screening for depression: Secondary | ICD-10-CM | POA: Diagnosis not present

## 2022-05-18 DIAGNOSIS — M2550 Pain in unspecified joint: Secondary | ICD-10-CM | POA: Diagnosis not present

## 2022-09-24 ENCOUNTER — Other Ambulatory Visit: Payer: Self-pay | Admitting: Family Medicine

## 2022-09-24 DIAGNOSIS — Z1231 Encounter for screening mammogram for malignant neoplasm of breast: Secondary | ICD-10-CM

## 2022-11-05 ENCOUNTER — Ambulatory Visit
Admission: RE | Admit: 2022-11-05 | Discharge: 2022-11-05 | Disposition: A | Discharge status: Home / Self Care | Payer: Medicare HMO | Source: Ambulatory Visit | Attending: Family Medicine | Admitting: Family Medicine

## 2022-11-05 DIAGNOSIS — Z1231 Encounter for screening mammogram for malignant neoplasm of breast: Secondary | ICD-10-CM

## 2023-02-25 ENCOUNTER — Other Ambulatory Visit
Admission: RE | Admit: 2023-02-25 | Discharge: 2023-02-25 | Disposition: A | Discharge status: Home / Self Care | Payer: Medicare HMO | Source: Ambulatory Visit | Attending: Ophthalmology | Admitting: Ophthalmology

## 2023-02-25 DIAGNOSIS — H43813 Vitreous degeneration, bilateral: Secondary | ICD-10-CM | POA: Diagnosis not present

## 2023-02-25 DIAGNOSIS — G4489 Other headache syndrome: Secondary | ICD-10-CM | POA: Insufficient documentation

## 2023-02-25 DIAGNOSIS — H353131 Nonexudative age-related macular degeneration, bilateral, early dry stage: Secondary | ICD-10-CM | POA: Diagnosis not present

## 2023-02-25 LAB — CBC WITH DIFFERENTIAL/PLATELET
Abs Immature Granulocytes: 0.05 10*3/uL (ref 0.00–0.07)
Basophils Absolute: 0 10*3/uL (ref 0.0–0.1)
Basophils Relative: 0 %
Eosinophils Absolute: 0.3 10*3/uL (ref 0.0–0.5)
Eosinophils Relative: 4 %
HCT: 34.9 % — ABNORMAL LOW (ref 36.0–46.0)
Hemoglobin: 10.9 g/dL — ABNORMAL LOW (ref 12.0–15.0)
Immature Granulocytes: 1 %
Lymphocytes Relative: 21 %
Lymphs Abs: 1.7 10*3/uL (ref 0.7–4.0)
MCH: 30.3 pg (ref 26.0–34.0)
MCHC: 31.2 g/dL (ref 30.0–36.0)
MCV: 96.9 fL (ref 80.0–100.0)
Monocytes Absolute: 0.9 10*3/uL (ref 0.1–1.0)
Monocytes Relative: 12 %
Neutro Abs: 5 10*3/uL (ref 1.7–7.7)
Neutrophils Relative %: 62 %
Platelets: 260 10*3/uL (ref 150–400)
RBC: 3.6 MIL/uL — ABNORMAL LOW (ref 3.87–5.11)
RDW: 15.4 % (ref 11.5–15.5)
WBC: 8 10*3/uL (ref 4.0–10.5)
nRBC: 0 % (ref 0.0–0.2)

## 2023-02-25 LAB — SEDIMENTATION RATE: Sed Rate: 26 mm/hr (ref 0–30)

## 2023-02-25 LAB — C-REACTIVE PROTEIN: CRP: <0.5 mg/dL (ref <1.0)

## 2023-05-22 DIAGNOSIS — Z79899 Other long term (current) drug therapy: Secondary | ICD-10-CM | POA: Diagnosis not present

## 2023-05-22 DIAGNOSIS — I10 Essential (primary) hypertension: Secondary | ICD-10-CM | POA: Diagnosis not present

## 2023-05-22 DIAGNOSIS — I129 Hypertensive chronic kidney disease with stage 1 through stage 4 chronic kidney disease, or unspecified chronic kidney disease: Secondary | ICD-10-CM | POA: Diagnosis not present

## 2023-05-22 DIAGNOSIS — E039 Hypothyroidism, unspecified: Secondary | ICD-10-CM | POA: Diagnosis not present

## 2023-05-22 DIAGNOSIS — K219 Gastro-esophageal reflux disease without esophagitis: Secondary | ICD-10-CM | POA: Diagnosis not present

## 2023-05-22 DIAGNOSIS — M810 Age-related osteoporosis without current pathological fracture: Secondary | ICD-10-CM | POA: Diagnosis not present

## 2023-05-22 DIAGNOSIS — Z1331 Encounter for screening for depression: Secondary | ICD-10-CM | POA: Diagnosis not present

## 2023-05-22 DIAGNOSIS — E785 Hyperlipidemia, unspecified: Secondary | ICD-10-CM | POA: Diagnosis not present

## 2023-05-22 DIAGNOSIS — M109 Gout, unspecified: Secondary | ICD-10-CM | POA: Diagnosis not present

## 2023-05-22 DIAGNOSIS — Z0000 Encounter for general adult medical examination without abnormal findings: Secondary | ICD-10-CM | POA: Diagnosis not present

## 2023-06-06 DIAGNOSIS — D649 Anemia, unspecified: Secondary | ICD-10-CM | POA: Diagnosis not present

## 2023-09-16 DIAGNOSIS — M8588 Other specified disorders of bone density and structure, other site: Secondary | ICD-10-CM | POA: Diagnosis not present

## 2023-09-30 ENCOUNTER — Encounter: Payer: Self-pay | Admitting: Family Medicine

## 2023-09-30 DIAGNOSIS — Z1231 Encounter for screening mammogram for malignant neoplasm of breast: Secondary | ICD-10-CM

## 2023-10-16 DIAGNOSIS — E785 Hyperlipidemia, unspecified: Secondary | ICD-10-CM | POA: Diagnosis not present

## 2023-10-16 DIAGNOSIS — N644 Mastodynia: Secondary | ICD-10-CM | POA: Diagnosis not present

## 2023-10-16 DIAGNOSIS — M810 Age-related osteoporosis without current pathological fracture: Secondary | ICD-10-CM | POA: Diagnosis not present

## 2023-10-16 DIAGNOSIS — E039 Hypothyroidism, unspecified: Secondary | ICD-10-CM | POA: Diagnosis not present

## 2023-10-16 DIAGNOSIS — I10 Essential (primary) hypertension: Secondary | ICD-10-CM | POA: Diagnosis not present

## 2023-10-16 DIAGNOSIS — M109 Gout, unspecified: Secondary | ICD-10-CM | POA: Diagnosis not present

## 2023-10-22 ENCOUNTER — Other Ambulatory Visit: Payer: Self-pay | Admitting: Family Medicine

## 2023-10-22 DIAGNOSIS — N644 Mastodynia: Secondary | ICD-10-CM

## 2023-11-12 ENCOUNTER — Ambulatory Visit
Admission: RE | Admit: 2023-11-12 | Discharge: 2023-11-12 | Disposition: A | Discharge status: Home / Self Care | Source: Ambulatory Visit | Attending: Family Medicine | Admitting: Family Medicine

## 2023-11-12 DIAGNOSIS — N644 Mastodynia: Secondary | ICD-10-CM | POA: Insufficient documentation

## 2023-11-12 DIAGNOSIS — R92323 Mammographic fibroglandular density, bilateral breasts: Secondary | ICD-10-CM | POA: Diagnosis not present

## 2024-02-28 DIAGNOSIS — H40003 Preglaucoma, unspecified, bilateral: Secondary | ICD-10-CM | POA: Diagnosis not present

## 2024-02-28 DIAGNOSIS — H43813 Vitreous degeneration, bilateral: Secondary | ICD-10-CM | POA: Diagnosis not present

## 2024-02-28 DIAGNOSIS — H04221 Epiphora due to insufficient drainage, right lacrimal gland: Secondary | ICD-10-CM | POA: Diagnosis not present

## 2024-02-28 DIAGNOSIS — Z0100 Encounter for examination of eyes and vision without abnormal findings: Secondary | ICD-10-CM | POA: Diagnosis not present

## 2024-02-28 DIAGNOSIS — H353131 Nonexudative age-related macular degeneration, bilateral, early dry stage: Secondary | ICD-10-CM | POA: Diagnosis not present

## 2024-03-10 DIAGNOSIS — Z0100 Encounter for examination of eyes and vision without abnormal findings: Secondary | ICD-10-CM | POA: Diagnosis not present

## 2024-06-17 DIAGNOSIS — M109 Gout, unspecified: Secondary | ICD-10-CM | POA: Diagnosis not present

## 2024-06-17 DIAGNOSIS — E039 Hypothyroidism, unspecified: Secondary | ICD-10-CM | POA: Diagnosis not present

## 2024-06-17 DIAGNOSIS — N189 Chronic kidney disease, unspecified: Secondary | ICD-10-CM | POA: Diagnosis not present

## 2024-06-17 DIAGNOSIS — Z1331 Encounter for screening for depression: Secondary | ICD-10-CM | POA: Diagnosis not present

## 2024-06-17 DIAGNOSIS — K219 Gastro-esophageal reflux disease without esophagitis: Secondary | ICD-10-CM | POA: Diagnosis not present

## 2024-06-17 DIAGNOSIS — I129 Hypertensive chronic kidney disease with stage 1 through stage 4 chronic kidney disease, or unspecified chronic kidney disease: Secondary | ICD-10-CM | POA: Diagnosis not present

## 2024-06-17 DIAGNOSIS — E785 Hyperlipidemia, unspecified: Secondary | ICD-10-CM | POA: Diagnosis not present

## 2024-06-17 DIAGNOSIS — M810 Age-related osteoporosis without current pathological fracture: Secondary | ICD-10-CM | POA: Diagnosis not present

## 2024-06-17 DIAGNOSIS — Z0000 Encounter for general adult medical examination without abnormal findings: Secondary | ICD-10-CM | POA: Diagnosis not present
